# Patient Record
Sex: Female | Born: 1990 | Race: White | Hispanic: No | Marital: Married | Smoking: Former smoker
Health system: Southern US, Community
[De-identification: ages and names within clinical notes are randomized; demographics above are authoritative.]

## PROBLEM LIST (undated history)

## (undated) DIAGNOSIS — Z789 Other specified health status: Secondary | ICD-10-CM

## (undated) DIAGNOSIS — E119 Type 2 diabetes mellitus without complications: Secondary | ICD-10-CM

## (undated) HISTORY — DX: Other specified health status: Z78.9

## (undated) HISTORY — DX: Type 2 diabetes mellitus without complications: E11.9

## (undated) HISTORY — PX: NO PAST SURGERIES: SHX2092

---

## 2013-12-30 ENCOUNTER — Emergency Department (HOSPITAL_COMMUNITY)
Admission: EM | Admit: 2013-12-30 | Discharge: 2013-12-30 | Disposition: A | Discharge status: Home / Self Care | Payer: BC Managed Care – PPO | Attending: Emergency Medicine | Admitting: Emergency Medicine

## 2013-12-30 ENCOUNTER — Encounter (HOSPITAL_COMMUNITY): Payer: Self-pay | Admitting: Emergency Medicine

## 2013-12-30 DIAGNOSIS — Z87891 Personal history of nicotine dependence: Secondary | ICD-10-CM | POA: Insufficient documentation

## 2013-12-30 DIAGNOSIS — Z91040 Latex allergy status: Secondary | ICD-10-CM | POA: Insufficient documentation

## 2013-12-30 DIAGNOSIS — Z3202 Encounter for pregnancy test, result negative: Secondary | ICD-10-CM | POA: Insufficient documentation

## 2013-12-30 DIAGNOSIS — R110 Nausea: Secondary | ICD-10-CM | POA: Insufficient documentation

## 2013-12-30 DIAGNOSIS — R1032 Left lower quadrant pain: Secondary | ICD-10-CM | POA: Insufficient documentation

## 2013-12-30 DIAGNOSIS — R1030 Lower abdominal pain, unspecified: Secondary | ICD-10-CM

## 2013-12-30 LAB — URINALYSIS, ROUTINE W REFLEX MICROSCOPIC
Bilirubin Urine: NEGATIVE
GLUCOSE, UA: NEGATIVE mg/dL
Hgb urine dipstick: NEGATIVE
Ketones, ur: NEGATIVE mg/dL
Leukocytes, UA: NEGATIVE
Nitrite: NEGATIVE
Protein, ur: NEGATIVE mg/dL
Urobilinogen, UA: 0.2 mg/dL (ref 0.0–1.0)
pH: 6 (ref 5.0–8.0)

## 2013-12-30 LAB — WET PREP, GENITAL
Clue Cells Wet Prep HPF POC: NONE SEEN
Trich, Wet Prep: NONE SEEN
WBC, Wet Prep HPF POC: NONE SEEN
Yeast Wet Prep HPF POC: NONE SEEN

## 2013-12-30 LAB — PREGNANCY, URINE: PREG TEST UR: NEGATIVE

## 2013-12-30 MED ORDER — IBUPROFEN 600 MG PO TABS
600.0000 mg | ORAL_TABLET | Freq: Four times a day (QID) | ORAL | Status: DC | PRN
Start: 2013-12-30 — End: 2020-06-19

## 2013-12-30 NOTE — Discharge Instructions (Signed)
Abdominal Pain, Women °Abdominal (stomach, pelvic, or belly) pain can be caused by many things. It is important to tell your doctor: °· The location of the pain. °· Does it come and go or is it present all the time? °· Are there things that start the pain (eating certain foods, exercise)? °· Are there other symptoms associated with the pain (fever, nausea, vomiting, diarrhea)? °All of this is helpful to know when trying to find the cause of the pain. °CAUSES  °· Stomach: virus or bacteria infection, or ulcer. °· Intestine: appendicitis (inflamed appendix), regional ileitis (Crohn's disease), ulcerative colitis (inflamed colon), irritable bowel syndrome, diverticulitis (inflamed diverticulum of the colon), or cancer of the stomach or intestine. °· Gallbladder disease or stones in the gallbladder. °· Kidney disease, kidney stones, or infection. °· Pancreas infection or cancer. °· Fibromyalgia (pain disorder). °· Diseases of the female organs: °· Uterus: fibroid (non-cancerous) tumors or infection. °· Fallopian tubes: infection or tubal pregnancy. °· Ovary: cysts or tumors. °· Pelvic adhesions (scar tissue). °· Endometriosis (uterus lining tissue growing in the pelvis and on the pelvic organs). °· Pelvic congestion syndrome (female organs filling up with blood just before the menstrual period). °· Pain with the menstrual period. °· Pain with ovulation (producing an egg). °· Pain with an IUD (intrauterine device, birth control) in the uterus. °· Cancer of the female organs. °· Functional pain (pain not caused by a disease, may improve without treatment). °· Psychological pain. °· Depression. °DIAGNOSIS  °Your doctor will decide the seriousness of your pain by doing an examination. °· Blood tests. °· X-rays. °· Ultrasound. °· CT scan (computed tomography, special type of X-ray). °· MRI (magnetic resonance imaging). °· Cultures, for infection. °· Barium enema (dye inserted in the large intestine, to better view it with  X-rays). °· Colonoscopy (looking in intestine with a lighted tube). °· Laparoscopy (minor surgery, looking in abdomen with a lighted tube). °· Major abdominal exploratory surgery (looking in abdomen with a large incision). °TREATMENT  °The treatment will depend on the cause of the pain.  °· Many cases can be observed and treated at home. °· Over-the-counter medicines recommended by your caregiver. °· Prescription medicine. °· Antibiotics, for infection. °· Birth control pills, for painful periods or for ovulation pain. °· Hormone treatment, for endometriosis. °· Nerve blocking injections. °· Physical therapy. °· Antidepressants. °· Counseling with a psychologist or psychiatrist. °· Minor or major surgery. °HOME CARE INSTRUCTIONS  °· Do not take laxatives, unless directed by your caregiver. °· Take over-the-counter pain medicine only if ordered by your caregiver. Do not take aspirin because it can cause an upset stomach or bleeding. °· Try a clear liquid diet (broth or water) as ordered by your caregiver. Slowly move to a bland diet, as tolerated, if the pain is related to the stomach or intestine. °· Have a thermometer and take your temperature several times a day, and record it. °· Bed rest and sleep, if it helps the pain. °· Avoid sexual intercourse, if it causes pain. °· Avoid stressful situations. °· Keep your follow-up appointments and tests, as your caregiver orders. °· If the pain does not go away with medicine or surgery, you may try: °· Acupuncture. °· Relaxation exercises (yoga, meditation). °· Group therapy. °· Counseling. °SEEK MEDICAL CARE IF:  °· You notice certain foods cause stomach pain. °· Your home care treatment is not helping your pain. °· You need stronger pain medicine. °· You want your IUD removed. °· You feel faint or   lightheaded. °· You develop nausea and vomiting. °· You develop a rash. °· You are having side effects or an allergy to your medicine. °SEEK IMMEDIATE MEDICAL CARE IF:  °· Your  pain does not go away or gets worse. °· You have a fever. °· Your pain is felt only in portions of the abdomen. The right side could possibly be appendicitis. The left lower portion of the abdomen could be colitis or diverticulitis. °· You are passing blood in your stools (bright red or black tarry stools, with or without vomiting). °· You have blood in your urine. °· You develop chills, with or without a fever. °· You pass out. °MAKE SURE YOU:  °· Understand these instructions. °· Will watch your condition. °· Will get help right away if you are not doing well or get worse. °Document Released: 07/25/2007 Document Revised: 12/20/2011 Document Reviewed: 08/14/2009 °[employer]® Patient Information ©2014 [employer]. ° °

## 2013-12-30 NOTE — ED Notes (Signed)
Pt c/o rlq pain that started after eating breakfast yesterday.  Reports pain went away but came back again after eating breakfast this morning.  Reports nausea, no vomiting.  Denies any vaginal bleeding or discharge.  Denies burning with urination.  LBM was today, LMP was "sometime last month."

## 2013-12-30 NOTE — ED Provider Notes (Signed)
CSN: [id number]     Arrival date & time 12/30/13  1156 History   First MD Initiated Contact with Patient 12/30/13 1214     Chief Complaint  Patient presents with  . Abdominal Pain     (Consider location/radiation/quality/duration/timing/severity/associated sxs/prior Treatment) HPI Pt is a 23yo female c/o RLQ pain that started yesterday after eating breakfast and again today after eating breakfast this morning. Pain is sharp and cramping, 9/10 at worse but currently 5/10.  Pt states pain lasts for 1-4hours. Earlier today standing made pain worse but when pt arrived to ED, walking helped improve pain after sitting for a prolonged period of time. Denies hx of similar pain. LMP only lasted 3 days last month, pt states normally lasts at least 5-6 days.  Pt also reports nausea but no vomiting. Denies fever or diarrhea. LBM was today. No hx of abdominal surgeries.   History reviewed. No pertinent past medical history. History reviewed. No pertinent past surgical history. No family history on file. History  Substance Use Topics  . Smoking status: Former Games developer  . Smokeless tobacco: Not on file  . Alcohol Use: No   OB History   Grav Para Term Preterm Abortions TAB SAB Ect Mult Living                 Review of Systems  Constitutional: Negative for fever and chills.  Respiratory: Negative for cough and shortness of breath.   Cardiovascular: Negative for chest pain.  Gastrointestinal: Positive for nausea and abdominal pain. Negative for vomiting, diarrhea and constipation.  Genitourinary: Positive for pelvic pain. Negative for dysuria, urgency, hematuria, flank pain, vaginal bleeding, vaginal discharge, vaginal pain and menstrual problem.  All other systems reviewed and are negative.      Allergies  Cinnamon; Coconut flavor; and Latex  Home Medications   Current Outpatient Rx  Name  Route  Sig  Dispense  Refill  . ibuprofen (ADVIL,MOTRIN) 600 MG tablet   Oral   Take 1 tablet (600  mg total) by mouth every 6 (six) hours as needed.   30 tablet   0    BP 124/79  Pulse 79  Temp(Src) 98.1 F (36.7 C) (Oral)  Resp 18  Ht 5\' 4"  (1.626 m)  Wt 250 lb (113.399 kg)  BMI 42.89 kg/m2  SpO2 99% Physical Exam  Nursing note and vitals reviewed. Constitutional: She appears well-developed and well-nourished. No distress.  HENT:  Head: Normocephalic and atraumatic.  Eyes: Conjunctivae are normal. No scleral icterus.  Neck: Normal range of motion.  Cardiovascular: Normal rate, regular rhythm and normal heart sounds.   Pulmonary/Chest: Effort normal and breath sounds normal. No respiratory distress. She has no wheezes. She has no rales. She exhibits no tenderness.  Abdominal: Soft. Bowel sounds are normal. She exhibits no distension and no mass. There is tenderness ( LLQ ). There is no rebound and no guarding.  Obese abdomen, soft, tenderness in LLQ. No rebound, guarding or masses. No tenderness in RLQ. No tenderness at [facility]. No Murphy's sign  Genitourinary:  Chaperoned exam. Normal external exam. No vaginal discharge. No cervical discharge, CMT, adnexal tenderness or masses.   Musculoskeletal: Normal range of motion.  Neurological: She is alert.  Skin: Skin is warm and dry. She is not diaphoretic.    ED Course  Procedures (including critical care time) Labs Review Labs Reviewed  URINALYSIS, ROUTINE W REFLEX MICROSCOPIC - Abnormal; Notable for the following:    APPearance HAZY (*)    Specific Gravity,  Urine >1.030 (*)    All other components within normal limits  WET PREP, GENITAL  GC/CHLAMYDIA PROBE AMP  PREGNANCY, URINE   Imaging Review No results found.   EKG Interpretation None      MDM   Final diagnoses:  Lower abdominal pain  Nausea    Pt c/o intermittent lower abdominal pain that started yesterday.  Pt appears well, non-toxic, afebrile. Pt c/o RLQ pain, however, on exam, pt is non-tender in RLQ but tender in LLQ. Abdomen is soft, no  rebound, guarding, or masses. No murphy's sighn.  Not concerned for surgical abdomen.  Pelvic exam: normal, no CMT, adnexal tenderness or masses. Normal wet prep and UA. Urine preg: negative.   Will discharge pt home. No further workup needed at this time, do not believe emergent process is taking place. Will have f/u with PCP. Rx: ibuprofen. Advised to avoid fried food and dairy products until nausea and pain improve.  Return precautions provided. Pt verbalized understanding and agreement with tx plan.     Junius Finnerrin O'Malley, PA-C 12/30/13 [phone]

## 2013-12-30 NOTE — ED Notes (Signed)
Pt alert & oriented x4, stable gait. Patient given discharge instructions, paperwork & prescription(s). Patient  instructed to stop at the registration desk to finish any additional paperwork. Patient verbalized understanding. Pt left department w/ no further questions. 

## 2013-12-31 LAB — GC/CHLAMYDIA PROBE AMP
CT Probe RNA: NEGATIVE
GC Probe RNA: NEGATIVE

## 2014-01-01 NOTE — ED Provider Notes (Signed)
Medical screening examination/treatment/procedure(s) were conducted as a shared visit with non-physician practitioner(s) and myself.  I personally evaluated the patient during the encounter.   EKG Interpretation None     No acute abdomen. Patient is nontender in RLQ  Donnetta HutchingBrian Misheel Gowans, MD 01/01/14 [phone]

## 2018-09-17 ENCOUNTER — Other Ambulatory Visit: Payer: Self-pay

## 2018-09-17 ENCOUNTER — Emergency Department (HOSPITAL_COMMUNITY)
Admission: EM | Admit: 2018-09-17 | Discharge: 2018-09-17 | Disposition: A | Discharge status: Home / Self Care | Payer: Self-pay | Attending: Emergency Medicine | Admitting: Emergency Medicine

## 2018-09-17 ENCOUNTER — Encounter (HOSPITAL_COMMUNITY): Payer: Self-pay | Admitting: Emergency Medicine

## 2018-09-17 DIAGNOSIS — Z87891 Personal history of nicotine dependence: Secondary | ICD-10-CM | POA: Insufficient documentation

## 2018-09-17 DIAGNOSIS — M545 Low back pain, unspecified: Secondary | ICD-10-CM

## 2018-09-17 DIAGNOSIS — Z79899 Other long term (current) drug therapy: Secondary | ICD-10-CM | POA: Insufficient documentation

## 2018-09-17 LAB — URINALYSIS, ROUTINE W REFLEX MICROSCOPIC
Bacteria, UA: NONE SEEN
Bilirubin Urine: NEGATIVE
GLUCOSE, UA: NEGATIVE mg/dL
Ketones, ur: NEGATIVE mg/dL
NITRITE: NEGATIVE
PH: 5 (ref 5.0–8.0)
PROTEIN: 100 mg/dL — AB
RBC / HPF: >50 RBC/hpf — ABNORMAL HIGH (ref 0–5)
Specific Gravity, Urine: 1.028 (ref 1.005–1.030)

## 2018-09-17 MED ORDER — DICLOFENAC SODIUM 50 MG PO TBEC
50.0000 mg | DELAYED_RELEASE_TABLET | Freq: Two times a day (BID) | ORAL | 0 refills | Status: DC
Start: 2018-09-17 — End: 2020-06-19

## 2018-09-17 MED ORDER — METHOCARBAMOL 500 MG PO TABS: 500.0000 mg | ORAL_TABLET | Freq: Two times a day (BID) | ORAL | 0 refills | Status: DC

## 2018-09-17 NOTE — Discharge Instructions (Addendum)
Return if any problems.

## 2018-09-17 NOTE — ED Provider Notes (Signed)
[facility] Provider Note   CSN: [id number] Arrival date & time: 09/17/18  1609     History   Chief Complaint Chief Complaint  Patient presents with  . Back Pain    HPI Rebecca FinnerJennifer Frey is a 27 y.o. female.  The history is provided by the patient. No language interpreter was used.  Back Pain   This is a new problem. The current episode started 6 to 12 hours ago. The problem occurs constantly. The problem has been resolved. The pain is associated with no known injury. She has tried nothing for the symptoms. The treatment provided no relief.   Pt reports she had pain in her low back.  Pt reports taking midol before coming in.  Pt reports pain has resolved No past medical history on file.  There are no active problems to display for this patient.   No past surgical history on file.   OB History   None      Home Medications    Prior to Admission medications   Medication Sig Start Date End Date Taking? Authorizing Provider  diclofenac (VOLTAREN) 50 MG EC tablet Take 1 tablet (50 mg total) by mouth 2 (two) times daily. 09/17/18   Elson AreasSofia, Huston Stonehocker K, PA-C  ibuprofen (ADVIL,MOTRIN) 600 MG tablet Take 1 tablet (600 mg total) by mouth every 6 (six) hours as needed. 12/30/13   Lurene ShadowPhelps, Erin O, PA-C  methocarbamol (ROBAXIN) 500 MG tablet Take 1 tablet (500 mg total) by mouth 2 (two) times daily. 09/17/18   Elson AreasSofia, Jayjay Littles K, PA-C    Family History No family history on file.  Social History Social History   Tobacco Use  . Smoking status: Former Smoker    Types: Cigarettes  . Smokeless tobacco: Never Used  Substance Use Topics  . Alcohol use: No  . Drug use: No     Allergies   Cinnamon; Coconut flavor; and Latex   Review of Systems Review of Systems  Musculoskeletal: Positive for back pain.  All other systems reviewed and are negative.    Physical Exam Updated Vital Signs BP (!) 161/94 (BP Location: Right Arm)   Pulse 85   Temp 98.3 F (36.8  C) (Oral)   Resp 14   Ht 5\' 4"  (1.626 m)   Wt 127.5 kg   LMP 09/17/2018   SpO2 100%   BMI 48.23 kg/m   Physical Exam  Constitutional: She is oriented to person, place, and time. She appears well-developed and well-nourished.  HENT:  Head: Normocephalic.  Eyes: EOM are normal.  Neck: Normal range of motion.  Cardiovascular: Normal rate.  Pulmonary/Chest: Effort normal.  Abdominal: She exhibits no distension.  Musculoskeletal: Normal range of motion.  Neurological: She is alert and oriented to person, place, and time.  Skin: Skin is warm.  Psychiatric: She has a normal mood and affect.  Nursing note and vitals reviewed.    ED Treatments / Results  Labs (all labs ordered are listed, but only abnormal results are displayed) Labs Reviewed  URINALYSIS, ROUTINE W REFLEX MICROSCOPIC - Abnormal; Notable for the following components:      Result Value   APPearance HAZY (*)    Hgb urine dipstick LARGE (*)    Protein, ur 100 (*)    Leukocytes, UA TRACE (*)    RBC / HPF >50 (*)    All other components within normal limits    EKG None  Radiology No results found.  Procedures Procedures (including critical care time)  Medications Ordered in ED Medications - No data to display   Initial Impression / Assessment and Plan / ED Course  I have reviewed the triage vital signs and the nursing notes.  Pertinent labs & imaging results that were available during my care of the patient were reviewed by me and considered in my medical decision making (see chart for details).     MDM ua shows blood,  Pt is on menses.  Pain sounds as if it was muscular.  I will give rx and pt can fill if pain returns   Final Clinical Impressions(s) / ED Diagnoses   Final diagnoses:  Acute low back pain, unspecified back pain laterality, unspecified whether sciatica present    ED Discharge Orders         Ordered    diclofenac (VOLTAREN) 50 MG EC tablet  2 times daily     09/17/18 1741     methocarbamol (ROBAXIN) 500 MG tablet  2 times daily     09/17/18 1741        An After Visit Summary was printed and given to the patient.    Elson Areas, Cordelia Poche 09/17/18 2052    Vanetta Mulders, MD 09/18/18 1530

## 2018-09-17 NOTE — ED Triage Notes (Addendum)
Patient c/o lower back pain that started this morning after waking up this morning. Patient denies any injury, radiation, or urinary symptoms. Per patient pain worse with movement. Denies any c/o in urination or BMs. CNS intact. Took Aleve with brief relief and midol 40 minutes prior to arriving to ED with no relief.

## 2018-09-17 NOTE — ED Notes (Signed)
Pt is on menstrual cycle.  Urine taken to lab.

## 2019-04-29 ENCOUNTER — Emergency Department (HOSPITAL_COMMUNITY)
Admission: EM | Admit: 2019-04-29 | Discharge: 2019-04-29 | Disposition: A | Discharge status: Home / Self Care | Payer: Self-pay | Attending: Emergency Medicine | Admitting: Emergency Medicine

## 2019-04-29 ENCOUNTER — Other Ambulatory Visit: Payer: Self-pay

## 2019-04-29 ENCOUNTER — Encounter (HOSPITAL_COMMUNITY): Payer: Self-pay | Admitting: Emergency Medicine

## 2019-04-29 DIAGNOSIS — Z91040 Latex allergy status: Secondary | ICD-10-CM | POA: Insufficient documentation

## 2019-04-29 DIAGNOSIS — Z87891 Personal history of nicotine dependence: Secondary | ICD-10-CM | POA: Insufficient documentation

## 2019-04-29 DIAGNOSIS — H9201 Otalgia, right ear: Secondary | ICD-10-CM

## 2019-04-29 DIAGNOSIS — H6091 Unspecified otitis externa, right ear: Secondary | ICD-10-CM

## 2019-04-29 DIAGNOSIS — H608X1 Other otitis externa, right ear: Secondary | ICD-10-CM | POA: Insufficient documentation

## 2019-04-29 MED ORDER — NEOMYCIN-POLYMYXIN-HC 1 % OT SOLN
4.0000 [drp] | Freq: Once | OTIC | Status: AC
  Administered 2019-04-29: 4 [drp] via OTIC
  Filled 2019-04-29: qty 10

## 2019-04-29 MED ORDER — TRAMADOL HCL 50 MG PO TABS
100.0000 mg | ORAL_TABLET | Freq: Once | ORAL | Status: AC
  Administered 2019-04-29: 100 mg via ORAL
  Filled 2019-04-29: qty 2

## 2019-04-29 MED ORDER — ONDANSETRON HCL 4 MG PO TABS
4.0000 mg | ORAL_TABLET | Freq: Once | ORAL | Status: AC
  Administered 2019-04-29: 4 mg via ORAL
  Filled 2019-04-29: qty 1

## 2019-04-29 MED ORDER — IBUPROFEN 800 MG PO TABS
800.0000 mg | ORAL_TABLET | Freq: Once | ORAL | Status: AC
  Administered 2019-04-29: 800 mg via ORAL
  Filled 2019-04-29: qty 1

## 2019-04-29 MED ORDER — CEPHALEXIN 500 MG PO CAPS
500.0000 mg | ORAL_CAPSULE | Freq: Once | ORAL | Status: AC
  Administered 2019-04-29: 500 mg via ORAL
  Filled 2019-04-29: qty 1

## 2019-04-29 MED ORDER — CEPHALEXIN 500 MG PO CAPS: 500.0000 mg | ORAL_CAPSULE | Freq: Four times a day (QID) | ORAL | 0 refills | Status: DC

## 2019-04-29 MED ORDER — TRAMADOL HCL 50 MG PO TABS: 50.0000 mg | ORAL_TABLET | Freq: Four times a day (QID) | ORAL | 0 refills | Status: DC | PRN

## 2019-04-29 NOTE — ED Provider Notes (Signed)
[facility] Provider Note   CSN: [id number] Arrival date & time: 04/29/19  1226     History   Chief Complaint Chief Complaint  Patient presents with  . Otalgia    right    HPI Rebecca FinnerJennifer Frey is a 28 y.o. female.     The history is provided by the patient.  Otalgia Location:  Right Behind ear:  No abnormality Quality:  Throbbing Severity:  Moderate Onset quality:  Gradual Duration:  4 days Timing:  Intermittent Progression:  Worsening Chronicity:  New Context comment:  Use ear buds alot. Uses Q-tips Relieved by:  Nothing Worsened by:  Palpation (movement) Ineffective treatments:  OTC medications Associated symptoms: ear discharge and headaches   Associated symptoms: no abdominal pain, no cough, no fever, no neck pain, no sore throat and no vomiting   Risk factors: no recent travel, no chronic ear infection and no prior ear surgery     History reviewed. No pertinent past medical history.  There are no active problems to display for this patient.   History reviewed. No pertinent surgical history.   OB History   No obstetric history on file.      Home Medications    Prior to Admission medications   Medication Sig Start Date End Date Taking? Authorizing Provider  diclofenac (VOLTAREN) 50 MG EC tablet Take 1 tablet (50 mg total) by mouth 2 (two) times daily. 09/17/18   Elson AreasSofia, Leslie K, PA-C  ibuprofen (ADVIL,MOTRIN) 600 MG tablet Take 1 tablet (600 mg total) by mouth every 6 (six) hours as needed. 12/30/13   Lurene ShadowPhelps, Erin O, PA-C  methocarbamol (ROBAXIN) 500 MG tablet Take 1 tablet (500 mg total) by mouth 2 (two) times daily. 09/17/18   Elson AreasSofia, Leslie K, PA-C    Family History History reviewed. No pertinent family history.  Social History Social History   Tobacco Use  . Smoking status: Former Smoker    Types: Cigarettes  . Smokeless tobacco: Never Used  Substance Use Topics  . Alcohol use: No  . Drug use: No     Allergies    Cinnamon, Coconut flavor, and Latex   Review of Systems Review of Systems  Constitutional: Negative for activity change and fever.       All ROS Neg except as noted in HPI  HENT: Positive for ear discharge and ear pain. Negative for nosebleeds and sore throat.   Eyes: Negative for photophobia and discharge.  Respiratory: Negative for cough, shortness of breath and wheezing.   Cardiovascular: Negative for chest pain and palpitations.  Gastrointestinal: Negative for abdominal pain, blood in stool and vomiting.  Genitourinary: Negative for dysuria, frequency and hematuria.  Musculoskeletal: Negative for arthralgias, back pain and neck pain.  Skin: Negative.   Neurological: Positive for headaches. Negative for dizziness, seizures and speech difficulty.  Psychiatric/Behavioral: Negative for confusion and hallucinations.     Physical Exam Updated Vital Signs BP 140/89 (BP Location: Left Arm)   Pulse 94   Temp 98.7 F (37.1 C) (Oral)   Resp 16   Ht 5\' 4"  (1.626 m)   Wt 122.5 kg   LMP 04/11/2019 (Exact Date)   SpO2 96%   BMI 46.35 kg/m   Physical Exam Vitals signs and nursing note reviewed.  Constitutional:      Appearance: She is well-developed. She is not toxic-appearing.  HENT:     Head: Normocephalic.     Right Ear: Tympanic membrane and external ear normal.     Left Ear:  Tympanic membrane and external ear normal.     Ears:     Comments: There is mild to moderate swelling of the preauricular nodes.  There is swelling of the external auditory canal on the right.  There is mild drainage from the ear.  The right tympanic membrane cannot be visualized.  There is no pain or swelling behind the ear.  There is no pain or swelling of the left ear.  The tympanic membrane is without bulging, redness, or problem. Eyes:     General: Lids are normal.     Pupils: Pupils are equal, round, and reactive to light.  Neck:     Musculoskeletal: Normal range of motion and neck supple.      Vascular: No carotid bruit.  Cardiovascular:     Rate and Rhythm: Normal rate and regular rhythm.     Pulses: Normal pulses.     Heart sounds: Normal heart sounds.  Pulmonary:     Effort: No respiratory distress.     Breath sounds: Normal breath sounds.  Abdominal:     General: Bowel sounds are normal.     Palpations: Abdomen is soft.     Tenderness: There is no abdominal tenderness. There is no guarding.  Musculoskeletal: Normal range of motion.  Lymphadenopathy:     Head:     Right side of head: No submandibular adenopathy.     Left side of head: No submandibular adenopathy.     Cervical: No cervical adenopathy.  Skin:    General: Skin is warm and dry.  Neurological:     Mental Status: She is alert and oriented to person, place, and time.     Cranial Nerves: No cranial nerve deficit.     Sensory: No sensory deficit.  Psychiatric:        Speech: Speech normal.      ED Treatments / Results  Labs (all labs ordered are listed, but only abnormal results are displayed) Labs Reviewed - No data to display  EKG None  Radiology No results found.  Procedures Procedures (including critical care time)  Medications Ordered in ED Medications  NEOMYCIN-POLYMYXIN-HYDROCORTISONE (CORTISPORIN) OTIC (EAR) solution 4 drop (has no administration in time range)  traMADol (ULTRAM) tablet 100 mg (has no administration in time range)  cephALEXin (KEFLEX) capsule 500 mg (has no administration in time range)  ondansetron (ZOFRAN) tablet 4 mg (has no administration in time range)  ibuprofen (ADVIL) tablet 800 mg (has no administration in time range)     Initial Impression / Assessment and Plan / ED Course  I have reviewed the triage vital signs and the nursing notes.  Pertinent labs & imaging results that were available during my care of the patient were reviewed by me and considered in my medical decision making (see chart for details).          Final Clinical Impressions(s) /  ED Diagnoses MdM  Vital signs within normal limits.  Pulse oximetry is 96% on room air.  Within normal limits by my interpretation.  The patient uses ear buds frequently and she also uses Q-tips.  The patient has a right external otitis.  The patient has some mild drainage present.  The patient will be treated with Cortisporin otic 4 drops 3 times a day, and Keflex.  Patient states that she at times has problems with nausea when taking antibiotics, will use Zofran for the nausea.  Patient is to follow-up with her primary physician or return to the emergency department if  any changes in condition, problems, or concerns.   Final diagnoses:  Otitis externa of right ear, unspecified chronicity, unspecified type  Otalgia of right ear    ED Discharge Orders         Ordered    cephALEXin (KEFLEX) 500 MG capsule  4 times daily     04/29/19 1325    traMADol (ULTRAM) 50 MG tablet  Every 6 hours PRN     04/29/19 1325           Ivery QualeBryant, Desi Rowe, PA-C 04/29/19 1328    Eber HongMiller, Brian, MD 04/29/19 [phone]

## 2019-04-29 NOTE — ED Triage Notes (Signed)
C/o right ear pain.  Pt used Q-tip about four days ago, report pain to ear prior.  Reports drainage from right eat.  Rates pain 6/10.

## 2019-04-29 NOTE — Discharge Instructions (Addendum)
You have an infection of the external ear.  Please refrain from using ear buds and or Q-tips until this has completely resolved.  Please wash hands frequently.  Use 4 drops of the Cortisporin to the right ear 3 times a day use Keflex with each meal and at bedtime until all taken.  Use 600 mg of ibuprofen with breakfast, lunch, dinner, and at bedtime.  May use Ultram for more severe pain. This medication may cause drowsiness. Please do not drink, drive, or participate in activity that requires concentration while taking this medication.

## 2020-06-19 ENCOUNTER — Other Ambulatory Visit: Payer: Self-pay

## 2020-06-19 ENCOUNTER — Encounter: Payer: Self-pay | Admitting: Adult Health

## 2020-06-19 ENCOUNTER — Ambulatory Visit (INDEPENDENT_AMBULATORY_CARE_PROVIDER_SITE_OTHER): Payer: Self-pay | Admitting: Adult Health

## 2020-06-19 ENCOUNTER — Ambulatory Visit (INDEPENDENT_AMBULATORY_CARE_PROVIDER_SITE_OTHER): Payer: Self-pay

## 2020-06-19 VITALS — BP 143/84 | HR 76 | Ht 64.0 in | Wt 272.0 lb

## 2020-06-19 DIAGNOSIS — O3680X0 Pregnancy with inconclusive fetal viability, not applicable or unspecified: Secondary | ICD-10-CM | POA: Insufficient documentation

## 2020-06-19 DIAGNOSIS — Z3A13 13 weeks gestation of pregnancy: Secondary | ICD-10-CM | POA: Insufficient documentation

## 2020-06-19 DIAGNOSIS — Z3201 Encounter for pregnancy test, result positive: Secondary | ICD-10-CM | POA: Insufficient documentation

## 2020-06-19 LAB — POCT URINE PREGNANCY: Preg Test, Ur: POSITIVE — AB

## 2020-06-19 MED ORDER — FLINTSTONES COMPLETE 18 MG PO CHEW: CHEWABLE_TABLET | ORAL | Status: AC

## 2020-06-19 NOTE — Progress Notes (Signed)
[place] 8+6 wks,single IUP with YS,positive fht 174 bpm,crl 22.2 mm,normal ovaries

## 2020-06-19 NOTE — Progress Notes (Signed)
  Subjective:     Patient ID: Rebecca Frey, female   DOB: [birth year], 29 y.o.   MRN: [id number]  HPI Rebecca Frey is a 29 year old white female, married, in for UPT, has missed periods and had 3+HPTs. Last period was light.   Review of Systems +missed periods 3+HPTs +nausea Reviewed past medical,surgical, social and family history. Reviewed medications and allergies.     Objective:   Physical Exam BP (!) 143/84 (BP Location: Left Arm, Patient Position: Sitting, Cuff Size: Large)   Pulse 76   Ht 5\' 4"  (1.626 m)   Wt 272 lb (123.4 kg)   LMP 03/19/2020   BMI 46.69 kg/m UPT +, about 13+1 week by LMP with EDD 12/25/19. Skin warm and dry. Neck: mid line trachea, normal thyroid, good ROM, no lymphadenopathy noted. Lungs: clear to ausculation bilaterally. Cardiovascular: regular rate and rhythm.Abdomen is soft and non tender AA is 2 Fall risk is low PHQ 9 score is 12, no SI declines meds    Upstream - 06/19/20 1456      Pregnancy Intention Screening   Does the patient want to become pregnant in the next year? Yes    Does the patient's partner want to become pregnant in the next year? Yes    Would the patient like to discuss contraceptive options today? N/A      Contraception Wrap Up   Current Method Pregnant/Seeking Pregnancy    End Method Pregnant/Seeking Pregnancy    Contraception Counseling Provided No             Assessment:     1. Positive pregnancy test Take 2 [place] daily  2. [redacted] weeks gestation of pregnancy Eat often   3. Encounter to determine fetal viability of pregnancy, single or unspecified fetus Get dating 08/19/20 today     Plan:     Review handout by [facility]

## 2020-07-15 ENCOUNTER — Encounter: Payer: Self-pay | Admitting: Advanced Practice Midwife

## 2020-07-15 DIAGNOSIS — O0990 Supervision of high risk pregnancy, unspecified, unspecified trimester: Secondary | ICD-10-CM | POA: Insufficient documentation

## 2020-07-16 ENCOUNTER — Other Ambulatory Visit: Payer: Self-pay | Admitting: Obstetrics & Gynecology

## 2020-07-16 DIAGNOSIS — Z3682 Encounter for antenatal screening for nuchal translucency: Secondary | ICD-10-CM

## 2020-07-17 ENCOUNTER — Encounter: Payer: Self-pay | Admitting: Advanced Practice Midwife

## 2020-07-17 ENCOUNTER — Ambulatory Visit (INDEPENDENT_AMBULATORY_CARE_PROVIDER_SITE_OTHER): Payer: Self-pay | Admitting: Advanced Practice Midwife

## 2020-07-17 ENCOUNTER — Ambulatory Visit (INDEPENDENT_AMBULATORY_CARE_PROVIDER_SITE_OTHER): Payer: Medicaid Other

## 2020-07-17 ENCOUNTER — Other Ambulatory Visit: Payer: Self-pay

## 2020-07-17 ENCOUNTER — Ambulatory Visit: Payer: Self-pay | Admitting: *Deleted

## 2020-07-17 VITALS — BP 131/86 | HR 70 | Wt 268.0 lb

## 2020-07-17 DIAGNOSIS — Z3682 Encounter for antenatal screening for nuchal translucency: Secondary | ICD-10-CM

## 2020-07-17 DIAGNOSIS — Z3401 Encounter for supervision of normal first pregnancy, first trimester: Secondary | ICD-10-CM | POA: Diagnosis not present

## 2020-07-17 DIAGNOSIS — Z3A12 12 weeks gestation of pregnancy: Secondary | ICD-10-CM | POA: Diagnosis not present

## 2020-07-17 DIAGNOSIS — Z1389 Encounter for screening for other disorder: Secondary | ICD-10-CM

## 2020-07-17 DIAGNOSIS — Z331 Pregnant state, incidental: Secondary | ICD-10-CM

## 2020-07-17 LAB — POCT URINALYSIS DIPSTICK OB
Blood, UA: NEGATIVE
Glucose, UA: NEGATIVE
Ketones, UA: NEGATIVE
Leukocytes, UA: NEGATIVE
Nitrite, UA: NEGATIVE
POC,PROTEIN,UA: NEGATIVE

## 2020-07-17 MED ORDER — BLOOD PRESSURE MONITOR MISC: 0 refills | Status: DC

## 2020-07-17 NOTE — Progress Notes (Signed)
[place] 12+6 wks,measurements c/w dates,crl 64.84 mm,NB present,FHR 148 bpm,normal ovaries,unable to obtain NT because of fetal position and pt body habitus

## 2020-07-17 NOTE — Progress Notes (Signed)
INITIAL OBSTETRICAL VISIT Patient name: Tenessa Marsee MRN [id number]  Date of birth: Jan 18, 1991 Chief Complaint:   Initial Prenatal Visit (nt/it)  History of Present Illness:   Madie Cahn is a 29 y.o. G1P0 Caucasian female at [redacted]w[redacted]d by [place] at 8 weeks with an Estimated Date of Delivery: 01/23/21 being seen today for her initial obstetrical visit.   Her obstetrical history is significant for first pregnancy.   Today she reports some burning pain in right hip/thigh.  Can't remember if she has ever had a pap, but has had pelvic exams before  Depression screen [facility] 2/9 07/17/2020 06/19/2020  Decreased Interest 2 1  Down, Depressed, Hopeless 1 0  PHQ - 2 Score 3 1  Altered sleeping 2 3  Tired, decreased energy 2 3  Change in appetite 3 2  Feeling bad or failure about yourself  0 0  Trouble concentrating 3 3  Moving slowly or fidgety/restless 0 0  Suicidal thoughts 0 0  PHQ-9 Score 13 12  Difficult doing work/chores - Not difficult at all   Sx are c/w 1st trimester pregnancy  Patient's last menstrual period was 03/19/2020. Last pap unsure Review of Systems:   Pertinent items are noted in HPI Denies cramping/contractions, leakage of fluid, vaginal bleeding, abnormal vaginal discharge w/ itching/odor/irritation, headaches, visual changes, shortness of breath, chest pain, abdominal pain, severe nausea/vomiting, or problems with urination or bowel movements unless otherwise stated above.  Pertinent History Reviewed:  Reviewed past medical,surgical, social, obstetrical and family history.  Reviewed problem list, medications and allergies. OB History  Gravida Para Term Preterm AB Living  1            SAB TAB Ectopic Multiple Live Births               # Outcome Date GA Lbr Len/2nd Weight Sex Delivery Anes PTL Lv  1 Current            Physical Assessment:   Vitals:   07/17/20 1608  BP: 131/86  Pulse: 70  Weight: 268 lb (121.6 kg)  Body mass index is 46 kg/m.       Physical  Examination:  General appearance - well appearing, and in no distress  Mental status - alert, oriented to person, place, and time  Psych:  She has a normal mood and affect  Skin - warm and dry, normal color, no suspicious lesions noted  Chest - effort normal  Heart - normal rate and regular rhythm  Abdomen - soft, nontender  Extremities:  No swelling or varicosities noted   TODAY'S NT [place] 12+6 wks,measurements c/w dates,crl 64.84 mm,NB present,FHR 148 bpm,normal ovaries,unable to obtain NT because of fetal position and pt body habitus   Results for orders placed or performed in visit on 07/17/20 (from the past 24 hour(s))  POC Urinalysis Dipstick OB   Collection Time: 07/17/20  4:31 PM  Result Value Ref Range   Color, UA     Clarity, UA     Glucose, UA Negative Negative   Bilirubin, UA     Ketones, UA neg    Spec Grav, UA     Blood, UA neg    pH, UA     POC,PROTEIN,UA Negative Negative, Trace, Small (1+), Moderate (2+), Large (3+), 4+   Urobilinogen, UA     Nitrite, UA neg    Leukocytes, UA Negative Negative   Appearance     Odor      Assessment & Plan:  1) Low-Risk Pregnancy  G1P0 at [redacted]w[redacted]d with an Estimated Date of Delivery: 01/23/21   2) Initial OB visit   Meds:  Meds ordered this encounter  Medications  . Blood Pressure Monitor MISC    Sig: For regular home bp monitoring during pregnancy    Dispense:  1 each    Refill:  0    Z34.81  Needs large cuff    OB appt today started at 1600, so no time for pap today.  Initial labs obtained Continue prenatal vitamins Reviewed n/v relief measures and warning s/s to report Reviewed recommended weight gain based on pre-gravid BMI Encouraged well-balanced diet Genetic & carrier screening discussed: requests Panorama, AFP and Horizon 14 , declines NT/IT Ultrasound discussed; fetal survey: requested CCNC completed> form faxed if has or is planning to apply for medicaid The nature of [facility] [employer]  with multiple MDs and other Advanced Practice Providers was explained to patient; also emphasized that fellows, residents, and students are part of our team.  Rx faxed. Check bp weekly, let us know if >140/90.        Jacklyn Shell 4:38 PM

## 2020-07-17 NOTE — Patient Instructions (Signed)
Rebecca Frey, I greatly value your feedback.  If you receive a survey following your visit with [place] today, we appreciate you taking the time to fill it out.  Thanks, Cathie Beams, DNP, CNM  [facility] HAS MOVED!!! It is now [facility] at [facility] ([address] [place], [place] [address]) Entrance located off of [address] Free 24/7 valet parking   Nausea & Vomiting  Have saltine crackers or pretzels by your bed and eat a few bites before you raise your head out of bed in the morning  Eat small frequent meals throughout the day instead of large meals  Drink plenty of fluids throughout the day to stay hydrated, just don't drink a lot of fluids with your meals.  This can make your stomach fill up faster making you feel sick  Do not brush your teeth right after you eat  Products with real ginger are good for nausea, like ginger ale and ginger hard candy Make sure it says made with real ginger!  Sucking on sour candy like lemon heads is also good for nausea  If your prenatal vitamins make you nauseated, take them at night so you will sleep through the nausea  Sea Bands  If you feel like you need medicine for the nausea & vomiting please let us know  If you are unable to keep any fluids or food down please let us know   Constipation  Drink plenty of fluid, preferably water, throughout the day  Eat foods high in fiber such as fruits, vegetables, and grains  Exercise, such as walking, is a good way to keep your bowels regular  Drink warm fluids, especially warm prune juice, or decaf coffee  Eat a 1/2 cup of real oatmeal (not instant), 1/2 cup applesauce, and 1/2-1 cup warm prune juice every day  If needed, you may take Colace (docusate sodium) stool softener once or twice a day to help keep the stool soft.   If you still are having problems with constipation, you may take Miralax once daily as needed to help keep your bowels regular.   Home  Blood Pressure Monitoring for Patients   Your provider has recommended that you check your blood pressure (BP) at least once a week at home. If you do not have a blood pressure cuff at home, one will be provided for you. Contact your provider if you have not received your monitor within 1 week.   Helpful Tips for Accurate Home Blood Pressure Checks   Don't smoke, exercise, or drink caffeine 30 minutes before checking your BP  Use the restroom before checking your BP (a full bladder can raise your pressure)  Relax in a comfortable upright chair  Feet on the ground  Left arm resting comfortably on a flat surface at the level of your heart  Legs uncrossed  Back supported  Sit quietly and don't talk  Place the cuff on your bare arm  Adjust snuggly, so that only two fingertips can fit between your skin and the top of the cuff  Check 2 readings separated by at least one minute  Keep a log of your BP readings  For a visual, please reference this diagram: http://ccnc.care/bpdiagram  Provider Name: [facility]     Phone: [phone]  Zone 1: ALL CLEAR  Continue to monitor your symptoms:   BP reading is less than 140 (top number) or less than 90 (bottom number)   No right upper stomach pain  No headaches or seeing spots  No feeling nauseated or throwing up  No swelling in face and hands  Zone 2: CAUTION Call your doctor's office for any of the following:   BP reading is greater than 140 (top number) or greater than 90 (bottom number)   Stomach pain under your ribs in the middle or right side  Headaches or seeing spots  Feeling nauseated or throwing up  Swelling in face and hands  Zone 3: EMERGENCY  Seek immediate medical care if you have any of the following:   BP reading is greater than160 (top number) or greater than 110 (bottom number)  Severe headaches not improving with Tylenol  Serious difficulty catching your breath  Any worsening symptoms  from Zone 2    First Trimester of Pregnancy The first trimester of pregnancy is from week 1 until the end of week 12 (months 1 through 3). A week after a sperm fertilizes an egg, the egg will implant on the wall of the uterus. This embryo will begin to develop into a baby. Genes from you and your partner are forming the baby. The female genes determine whether the baby is a boy or a girl. At 6-8 weeks, the eyes and face are formed, and the heartbeat can be seen on ultrasound. At the end of 12 weeks, all the baby's organs are formed.  Now that you are pregnant, you will want to do everything you can to have a healthy baby. Two of the most important things are to get good prenatal care and to follow your health care provider's instructions. Prenatal care is all the medical care you receive before the baby's birth. This care will help prevent, find, and treat any problems during the pregnancy and childbirth. BODY CHANGES Your body goes through many changes during pregnancy. The changes vary from woman to woman.   You may gain or lose a couple of pounds at first.  You may feel sick to your stomach (nauseous) and throw up (vomit). If the vomiting is uncontrollable, call your health care provider.  You may tire easily.  You may develop headaches that can be relieved by medicines approved by your health care provider.  You may urinate more often. Painful urination may mean you have a bladder infection.  You may develop heartburn as a result of your pregnancy.  You may develop constipation because certain hormones are causing the muscles that push waste through your intestines to slow down.  You may develop hemorrhoids or swollen, bulging veins (varicose veins).  Your breasts may begin to grow larger and become tender. Your nipples may stick out more, and the tissue that surrounds them (areola) may become darker.  Your gums may bleed and may be sensitive to brushing and flossing.  Dark spots or  blotches (chloasma, mask of pregnancy) may develop on your face. This will likely fade after the baby is born.  Your menstrual periods will stop.  You may have a loss of appetite.  You may develop cravings for certain kinds of food.  You may have changes in your emotions from day to day, such as being excited to be pregnant or being concerned that something may go wrong with the pregnancy and baby.  You may have more vivid and strange dreams.  You may have changes in your hair. These can include thickening of your hair, rapid growth, and changes in texture. Some women also have hair loss during or after pregnancy, or hair that feels dry  or thin. Your hair will most likely return to normal after your baby is born. WHAT TO EXPECT AT YOUR PRENATAL VISITS During a routine prenatal visit:  You will be weighed to make sure you and the baby are growing normally.  Your blood pressure will be taken.  Your abdomen will be measured to track your baby's growth.  The fetal heartbeat will be listened to starting around week 10 or 12 of your pregnancy.  Test results from any previous visits will be discussed. Your health care provider may ask you:  How you are feeling.  If you are feeling the baby move.  If you have had any abnormal symptoms, such as leaking fluid, bleeding, severe headaches, or abdominal cramping.  If you have any questions. Other tests that may be performed during your first trimester include:  Blood tests to find your blood type and to check for the presence of any previous infections. They will also be used to check for low iron levels (anemia) and Rh antibodies. Later in the pregnancy, blood tests for diabetes will be done along with other tests if problems develop.  Urine tests to check for infections, diabetes, or protein in the urine.  An ultrasound to confirm the proper growth and development of the baby.  An amniocentesis to check for possible genetic  problems.  Fetal screens for spina bifida and Down syndrome.  You may need other tests to make sure you and the baby are doing well. HOME CARE INSTRUCTIONS  Medicines  Follow your health care provider's instructions regarding medicine use. Specific medicines may be either safe or unsafe to take during pregnancy.  Take your prenatal vitamins as directed.  If you develop constipation, try taking a stool softener if your health care provider approves. Diet  Eat regular, well-balanced meals. Choose a variety of foods, such as meat or vegetable-based protein, fish, milk and low-fat dairy products, vegetables, fruits, and whole grain breads and cereals. Your health care provider will help you determine the amount of weight gain that is right for you.  Avoid raw meat and uncooked cheese. These carry germs that can cause birth defects in the baby.  Eating four or five small meals rather than three large meals a day may help relieve nausea and vomiting. If you start to feel nauseous, eating a few soda crackers can be helpful. Drinking liquids between meals instead of during meals also seems to help nausea and vomiting.  If you develop constipation, eat more high-fiber foods, such as fresh vegetables or fruit and whole grains. Drink enough fluids to keep your urine clear or pale yellow. Activity and Exercise  Exercise only as directed by your health care provider. Exercising will help you:  Control your weight.  Stay in shape.  Be prepared for labor and delivery.  Experiencing pain or cramping in the lower abdomen or low back is a good sign that you should stop exercising. Check with your health care provider before continuing normal exercises.  Try to avoid standing for long periods of time. Move your legs often if you must stand in one place for a long time.  Avoid heavy lifting.  Wear low-heeled shoes, and practice good posture.  You may continue to have sex unless your health care  provider directs you otherwise. Relief of Pain or Discomfort  Wear a good support bra for breast tenderness.    Take warm sitz baths to soothe any pain or discomfort caused by hemorrhoids. Use hemorrhoid cream if your  health care provider approves.    Rest with your legs elevated if you have leg cramps or low back pain.  If you develop varicose veins in your legs, wear support hose. Elevate your feet for 15 minutes, 3-4 times a day. Limit salt in your diet. Prenatal Care  Schedule your prenatal visits by the twelfth week of pregnancy. They are usually scheduled monthly at first, then more often in the last 2 months before delivery.  Write down your questions. Take them to your prenatal visits.  Keep all your prenatal visits as directed by your health care provider. Safety  Wear your seat belt at all times when driving.  Make a list of emergency phone numbers, including numbers for family, friends, the hospital, and police and fire departments. General Tips  Ask your health care provider for a referral to a local prenatal education class. Begin classes no later than at the beginning of month 6 of your pregnancy.  Ask for help if you have counseling or nutritional needs during pregnancy. Your health care provider can offer advice or refer you to specialists for help with various needs.  Do not use hot tubs, steam rooms, or saunas.  Do not douche or use tampons or scented sanitary pads.  Do not cross your legs for long periods of time.  Avoid cat litter boxes and soil used by cats. These carry germs that can cause birth defects in the baby and possibly loss of the fetus by miscarriage or stillbirth.  Avoid all smoking, herbs, alcohol, and medicines not prescribed by your health care provider. Chemicals in these affect the formation and growth of the baby.  Schedule a dentist appointment. At home, brush your teeth with a soft toothbrush and be gentle when you floss. SEEK MEDICAL  CARE IF:   You have dizziness.  You have mild pelvic cramps, pelvic pressure, or nagging pain in the abdominal area.  You have persistent nausea, vomiting, or diarrhea.  You have a bad smelling vaginal discharge.  You have pain with urination.  You notice increased swelling in your face, hands, legs, or ankles. SEEK IMMEDIATE MEDICAL CARE IF:   You have a fever.  You are leaking fluid from your vagina.  You have spotting or bleeding from your vagina.  You have severe abdominal cramping or pain.  You have rapid weight gain or loss.  You vomit blood or material that looks like coffee grounds.  You are exposed to [place] measles and have never had them.  You are exposed to fifth disease or chickenpox.  You develop a severe headache.  You have shortness of breath.  You have any kind of trauma, such as from a fall or a car accident. Document Released: 09/21/2001 Document Revised: 02/11/2014 Document Reviewed: 08/07/2013 [facility] Patient Information 2015 [place], [place]. This information is not intended to replace advice given to you by your health care provider. Make sure you discuss any questions you have with your health care provider.  Coronavirus (COVID-19) Are you at risk?  Are you at risk for the Coronavirus (COVID-19)?  To be considered HIGH RISK for Coronavirus (COVID-19), you have to meet the following criteria:   Traveled to [place], [place], [place], [place] or [place]; or in the [place] to [place], [place], [place], or [place]; and have fever, cough, and shortness of breath within the last 2 weeks of travel OR  Been in close contact with a person diagnosed with COVID-19 within the last 2 weeks and  have fever, cough, and shortness of breath  IF YOU DO NOT MEET THESE CRITERIA, YOU ARE CONSIDERED LOW RISK FOR COVID-19.  What to do if you are HIGH RISK for COVID-19?   If you are having a medical emergency, call 911.  Seek medical care right away.  Before you go to a doctors office, urgent care or emergency department, call ahead and tell them about your recent travel, contact with someone diagnosed with COVID-19, and your symptoms. You should receive instructions from your physicians office regarding next steps of care.   When you arrive at healthcare provider, tell the healthcare staff immediately you have returned from visiting [place], [place], [place], [place] or [place]; or traveled in the [place] to [place], [place], [place], or [place]; in the last two weeks or you have been in close contact with a person diagnosed with COVID-19 in the last 2 weeks.    Tell the health care staff about your symptoms: fever, cough and shortness of breath.  After you have been seen by a medical provider, you will be either: o Tested for (COVID-19) and discharged home on quarantine except to seek medical care if symptoms worsen, and asked to  - Stay home and avoid contact with others until you get your results (4-5 days)  - Avoid travel on public transportation if possible (such as bus, train, or airplane) or o Sent to the Emergency Department by EMS for evaluation, COVID-19 testing, and possible admission depending on your condition and test results.  What to do if you are LOW RISK for COVID-19?  Reduce your risk of any infection by using the same precautions used for avoiding the common cold or flu:   Wash your hands often with soap and warm water for at least 20 seconds.  If soap and water are not readily available, use an alcohol-based hand sanitizer with at least 60% alcohol.   If coughing or sneezing, cover your mouth and nose by coughing or sneezing into the elbow areas of your shirt or coat, into a tissue or into your sleeve (not your hands).  Avoid shaking hands with others and consider head nods or verbal greetings only.  Avoid touching your eyes, nose, or mouth with unwashed hands.   Avoid close contact with people who are  sick.  Avoid places or events with large numbers of people in one location, like concerts or sporting events.  Carefully consider travel plans you have or are making.  If you are planning any travel outside or inside the [place], visit the [facility] webpage for the latest health notices.  If you have some symptoms but not all symptoms, continue to monitor at home and seek medical attention if your symptoms worsen.  If you are having a medical emergency, call 911.   ADDITIONAL HEALTHCARE OPTIONS FOR PATIENTS  [place] [facility] / e-Visit: [email or web address]         [facility]: [phone]  [facility]: [phone]                   [facility]: [phone]     Safe Medications in Pregnancy   Acne: Benzoyl Peroxide Salicylic Acid  Backache/Headache: Tylenol: 2 regular strength every 4 hours OR              2 Extra strength every 6 hours  Colds/Coughs/Allergies: Benadryl (alcohol free) 25 mg every 6 hours as needed Breath right strips Claritin Cepacol throat  lozenges Chloraseptic throat spray Cold-Eeze- up to three times per day Cough drops, alcohol free Flonase (by prescription only) Guaifenesin Mucinex Robitussin DM (plain only, alcohol free) Saline nasal spray/drops Sudafed (pseudoephedrine) & Actifed ** use only after [redacted] weeks gestation and if you do not have high blood pressure Tylenol Vicks Vaporub Zinc lozenges Zyrtec   Constipation: Colace Ducolax suppositories Fleet enema Glycerin suppositories Metamucil Milk of magnesia Miralax Senokot Smooth move tea  Diarrhea: Kaopectate Imodium A-D  *NO pepto Bismol  Hemorrhoids: Anusol Anusol HC Preparation H Tucks  Indigestion: Tums Maalox Mylanta Zantac  Pepcid  Insomnia: Benadryl (alcohol free)  every 6 hours as needed Tylenol PM Unisom, no Gelcaps  Leg  Cramps: Tums MagGel  Nausea/Vomiting:  Bonine Dramamine Emetrol Ginger extract Sea bands Meclizine  Nausea medication to take during pregnancy:  Unisom (doxylamine succinate 25 mg tablets) Take one tablet daily at bedtime. If symptoms are not adequately controlled, the dose can be increased to a maximum recommended dose of two tablets daily (1/2 tablet in the morning, 1/2 tablet mid-afternoon and one at bedtime). Vitamin B6  tablets. Take one tablet twice a day (up to 200 mg per day).  Skin Rashes: Aveeno products Benadryl cream or  every 6 hours as needed Calamine Lotion 1% cortisone cream  Yeast infection: Gyne-lotrimin 7 Monistat 7   **If taking multiple medications, please check labels to avoid duplicating the same active ingredients **take medication as directed on the label ** Do not exceed 4000 mg of tylenol in 24 hours **Do not take medications that contain aspirin or ibuprofen     Sciatica Rehab Ask your health care provider which exercises are safe for you. Do exercises exactly as told by your health care provider and adjust them as directed. It is normal to feel mild stretching, pulling, tightness, or discomfort as you do these exercises, but you should stop right away if you feel sudden pain or your pain gets worse.Do not begin these exercises until told by your health care provider. Stretching and range of motion exercises These exercises warm up your muscles and joints and improve the movement and flexibility of your hips and your back. These exercises also help to relieve pain, numbness, and tingling. Exercise A: Sciatic nerve glide  1. Sit in a chair with your head facing down toward your chest. Place your hands behind your back. Let your shoulders slump forward. 2. Slowly straighten one of your knees while you tilt your head back as if you are looking toward the ceiling. Only straighten your leg as far as you can without making your symptoms  worse. 3. Hold for __________ seconds. 4. Slowly return to the starting position. 5. Repeat with your other leg. Repeat __________ times. Complete this exercise __________ times a day. Exercise B: Knee to chest with hip adduction and internal rotation    1. Lie on your back on a firm surface with both legs straight. 2. Bend one of your knees and move it up toward your chest until you feel a gentle stretch in your lower back and buttock. Then, move your knee toward the shoulder that is on the opposite side from your leg. ? Hold your leg in this position by holding onto the front of your knee. 3. Hold for __________ seconds. 4. Slowly return to the starting position. 5. Repeat with your other leg. Repeat __________ times. Complete this exercise __________ times a day. Exercise C: Prone extension on elbows    1. Lie on your abdomen  on a firm surface. A bed may be too soft for this exercise. 2. Prop yourself up on your elbows. 3. Use your arms to help lift your chest up until you feel a gentle stretch in your abdomen and your lower back. ? This will place some of your body weight on your elbows. If this is uncomfortable, try stacking pillows under your chest. ? Your hips should stay down, against the surface that you are lying on. Keep your hip and back muscles relaxed. 4. Hold for __________ seconds. 5. Slowly relax your upper body and return to the starting position. Repeat __________ times. Complete this exercise __________ times a day. Strengthening exercises These exercises build strength and endurance in your back. Endurance is the ability to use your muscles for a long time, even after they get tired. Exercise D: Pelvic tilt  1. Lie on your back on a firm surface. Bend your knees and keep your feet flat. 2. Tense your abdominal muscles. Tip your pelvis up toward the ceiling and flatten your lower back into the floor. ? To help with this exercise, you may place a small towel under  your lower back and try to push your back into the towel. 3. Hold for __________ seconds. 4. Let your muscles relax completely before you repeat this exercise. Repeat __________ times. Complete this exercise __________ times a day. Exercise E: Alternating arm and leg raises    1. Get on your hands and knees on a firm surface. If you are on a hard floor, you may want to use padding to cushion your knees, such as an exercise mat. 2. Line up your arms and legs. Your hands should be below your shoulders, and your knees should be below your hips. 3. Lift your left leg behind you. At the same time, raise your right arm and straighten it in front of you. ? Do not lift your leg higher than your hip. ? Do not lift your arm higher than your shoulder. ? Keep your abdominal and back muscles tight. ? Keep your hips facing the ground. ? Do not arch your back. ? Keep your balance carefully, and do not hold your breath. 4. Hold for __________ seconds. 5. Slowly return to the starting position and repeat with your right leg and your left arm. Repeat __________ times. Complete this exercise __________ times a day. Posture and body mechanics    Body mechanics refers to the movements and positions of your body while you do your daily activities. Posture is part of body mechanics. Good posture and healthy body mechanics can help to relieve stress in your body's tissues and joints. Good posture means that your spine is in its natural S-curve position (your spine is neutral), your shoulders are pulled back slightly, and your head is not tipped forward. The following are general guidelines for applying improved posture and body mechanics to your everyday activities. Standing     When standing, keep your spine neutral and your feet about hip-width apart. Keep a slight bend in your knees. Your ears, shoulders, and hips should line up.  When you do a task in which you stand in one place for a long time, place  one foot up on a stable object that is 2-4 inches (5-10 cm) high, such as a footstool. This helps keep your spine neutral. Sitting     When sitting, keep your spine neutral and keep your feet flat on the floor. Use a footrest, if necessary, and keep your  thighs parallel to the floor. Avoid rounding your shoulders, and avoid tilting your head forward.  When working at a desk or a computer, keep your desk at a height where your hands are slightly lower than your elbows. Slide your chair under your desk so you are close enough to maintain good posture.  When working at a computer, place your monitor at a height where you are looking straight ahead and you do not have to tilt your head forward or downward to look at the screen. Resting     When lying down and resting, avoid positions that are most painful for you.  If you have pain with activities such as sitting, bending, stooping, or squatting (flexion-based activities), lie in a position in which your body does not bend very much. For example, avoid curling up on your side with your arms and knees near your chest (fetal position).  If you have pain with activities such as standing for a long time or reaching with your arms (extension-based activities), lie with your spine in a neutral position and bend your knees slightly. Try the following positions: ? Lying on your side with a pillow between your knees. ? Lying on your back with a pillow under your knees. Lifting     When lifting objects, keep your feet at least shoulder-width apart and tighten your abdominal muscles.  Bend your knees and hips and keep your spine neutral. It is important to lift using the strength of your legs, not your back. Do not lock your knees straight out.  Always ask for help to lift heavy or awkward objects. This information is not intended to replace advice given to you by your health care provider. Make sure you discuss any questions you have with your  health care provider. Document Released: 09/27/2005 Document Revised: 06/03/2016 Document Reviewed: 06/13/2015 Elsevier Interactive Patient Education  2017 [employer]

## 2020-07-18 DIAGNOSIS — Z3A12 12 weeks gestation of pregnancy: Secondary | ICD-10-CM | POA: Diagnosis not present

## 2020-07-18 DIAGNOSIS — Z3401 Encounter for supervision of normal first pregnancy, first trimester: Secondary | ICD-10-CM | POA: Diagnosis not present

## 2020-07-18 LAB — CBC/D/PLT+RPR+RH+ABO+RUB AB...
Antibody Screen: NEGATIVE
Basophils Absolute: 0 10*3/uL (ref 0.0–0.2)
Basos: 0 %
EOS (ABSOLUTE): 0.1 10*3/uL (ref 0.0–0.4)
Eos: 1 %
HCV Ab: <0.1 s/co ratio (ref 0.0–0.9)
HIV Screen 4th Generation wRfx: NONREACTIVE
Hematocrit: 36 % (ref 34.0–46.6)
Hemoglobin: 11.9 g/dL (ref 11.1–15.9)
Hepatitis B Surface Ag: NEGATIVE
Immature Grans (Abs): 0 10*3/uL (ref 0.0–0.1)
Immature Granulocytes: 0 %
Lymphocytes Absolute: 2.3 10*3/uL (ref 0.7–3.1)
Lymphs: 22 %
MCH: 27.8 pg (ref 26.6–33.0)
MCHC: 33.1 g/dL (ref 31.5–35.7)
MCV: 84 fL (ref 79–97)
Monocytes Absolute: 0.8 10*3/uL (ref 0.1–0.9)
Monocytes: 8 %
Neutrophils Absolute: 7.2 10*3/uL — ABNORMAL HIGH (ref 1.4–7.0)
Neutrophils: 69 %
Platelets: 295 10*3/uL (ref 150–450)
RBC: 4.28 x10E6/uL (ref 3.77–5.28)
RDW: 13.9 % (ref 11.7–15.4)
RPR Ser Ql: NONREACTIVE
Rh Factor: NEGATIVE
Rubella Antibodies, IGG: 7.22 index (ref >0.99)
WBC: 10.5 10*3/uL (ref 3.4–10.8)

## 2020-07-18 LAB — HCV INTERPRETATION

## 2020-07-20 LAB — PMP SCREEN PROFILE (10S), URINE
BENZODIAZEPINE SCREEN, URINE: NEGATIVE ng/mL
Cocaine (Metab) Scrn, Ur: NEGATIVE ng/mL
OXYCODONE+OXYMORPHONE UR QL SCN: NEGATIVE ng/mL
Phencyclidine Qn, Ur: NEGATIVE ng/mL
Propoxyphene Scrn, Ur: NEGATIVE ng/mL

## 2020-07-20 LAB — MED LIST OPTION NOT SELECTED

## 2020-07-21 LAB — PMP SCREEN PROFILE (10S), URINE
Amphetamine Scrn, Ur: NEGATIVE ng/mL
BARBITURATE SCREEN URINE: NEGATIVE ng/mL
CANNABINOIDS UR QL SCN: NEGATIVE ng/mL
Creatinine(Crt), U: 226.7 mg/dL (ref 20.0–300.0)
Methadone Screen, Urine: NEGATIVE ng/mL
Opiate Scrn, Ur: NEGATIVE ng/mL
Ph of Urine: 6.6 (ref 4.5–8.9)

## 2020-07-22 LAB — GC/CHLAMYDIA PROBE AMP
Chlamydia trachomatis, NAA: NEGATIVE
Neisseria Gonorrhoeae by PCR: NEGATIVE

## 2020-07-23 LAB — URINE CULTURE

## 2020-08-13 ENCOUNTER — Other Ambulatory Visit (HOSPITAL_COMMUNITY)
Admission: RE | Admit: 2020-08-13 | Discharge: 2020-08-13 | Disposition: A | Discharge status: Home / Self Care | Payer: Medicaid Other | Source: Ambulatory Visit | Attending: Advanced Practice Midwife | Admitting: Advanced Practice Midwife

## 2020-08-13 ENCOUNTER — Encounter: Payer: Self-pay | Admitting: Advanced Practice Midwife

## 2020-08-13 ENCOUNTER — Ambulatory Visit (INDEPENDENT_AMBULATORY_CARE_PROVIDER_SITE_OTHER): Payer: Medicaid Other | Admitting: Advanced Practice Midwife

## 2020-08-13 VITALS — BP 135/83 | HR 73 | Wt 268.8 lb

## 2020-08-13 DIAGNOSIS — Z1389 Encounter for screening for other disorder: Secondary | ICD-10-CM

## 2020-08-13 DIAGNOSIS — Z01419 Encounter for gynecological examination (general) (routine) without abnormal findings: Secondary | ICD-10-CM | POA: Insufficient documentation

## 2020-08-13 DIAGNOSIS — Z1151 Encounter for screening for human papillomavirus (HPV): Secondary | ICD-10-CM | POA: Insufficient documentation

## 2020-08-13 DIAGNOSIS — O26899 Other specified pregnancy related conditions, unspecified trimester: Secondary | ICD-10-CM | POA: Insufficient documentation

## 2020-08-13 DIAGNOSIS — Z124 Encounter for screening for malignant neoplasm of cervix: Secondary | ICD-10-CM

## 2020-08-13 DIAGNOSIS — Z331 Pregnant state, incidental: Secondary | ICD-10-CM

## 2020-08-13 DIAGNOSIS — Z363 Encounter for antenatal screening for malformations: Secondary | ICD-10-CM

## 2020-08-13 DIAGNOSIS — Z1379 Encounter for other screening for genetic and chromosomal anomalies: Secondary | ICD-10-CM | POA: Diagnosis not present

## 2020-08-13 DIAGNOSIS — Z3A16 16 weeks gestation of pregnancy: Secondary | ICD-10-CM | POA: Diagnosis not present

## 2020-08-13 DIAGNOSIS — Z6791 Unspecified blood type, Rh negative: Secondary | ICD-10-CM

## 2020-08-13 DIAGNOSIS — Z3402 Encounter for supervision of normal first pregnancy, second trimester: Secondary | ICD-10-CM

## 2020-08-13 LAB — POCT URINALYSIS DIPSTICK OB
Blood, UA: NEGATIVE
Glucose, UA: NEGATIVE
Leukocytes, UA: NEGATIVE
Nitrite, UA: NEGATIVE
POC,PROTEIN,UA: NEGATIVE

## 2020-08-13 NOTE — Progress Notes (Signed)
   LOW-RISK PREGNANCY VISIT Patient name: Rebecca Frey MRN [id number]  Date of birth: Jan 20, 1991 Chief Complaint:   Routine Prenatal Visit  History of Present Illness:   Rebecca Frey is a 29 y.o. G1P0 female at [redacted]w[redacted]d with an Estimated Date of Delivery: 01/23/21 being seen today for ongoing management of a low-risk pregnancy.  Today she reports doing well; shared that her husband has club feet so she is a little anxious about her anatomy u/s . Contractions: Not present. Vag. Bleeding: None.  Movement: Present. denies leaking of fluid. Review of Systems:   Pertinent items are noted in HPI Denies abnormal vaginal discharge w/ itching/odor/irritation, headaches, visual changes, shortness of breath, chest pain, abdominal pain, severe nausea/vomiting, or problems with urination or bowel movements unless otherwise stated above. Pertinent History Reviewed:  Reviewed past medical,surgical, social, obstetrical and family history.  Reviewed problem list, medications and allergies. Physical Assessment:   Vitals:   08/13/20 0941  BP: 135/83  Pulse: 73  Weight: 268 lb 12.8 oz (121.9 kg)  Body mass index is 46.14 kg/m.        Physical Examination:   General appearance: Well appearing, and in no distress  Mental status: Alert, oriented to person, place, and time  Skin: Warm & dry  Cardiovascular: Normal heart rate noted  Respiratory: Normal respiratory effort, no distress  Abdomen: Soft, gravid, nontender  Pelvic: Cervical exam deferred         Extremities: Edema: Trace  Fetal Status: Fetal Heart Rate (bpm): 136   Movement: Present    Results for orders placed or performed in visit on 08/13/20 (from the past 24 hour(s))  POC Urinalysis Dipstick OB   Collection Time: 08/13/20  9:38 AM  Result Value Ref Range   Color, UA     Clarity, UA     Glucose, UA Negative Negative   Bilirubin, UA     Ketones, UA small    Spec Grav, UA     Blood, UA neg    pH, UA     POC,PROTEIN,UA Negative  Negative, Trace, Small (1+), Moderate (2+), Large (3+), 4+   Urobilinogen, UA     Nitrite, UA neg    Leukocytes, UA Negative Negative   Appearance     Odor      Assessment & Plan:  1) Low-risk pregnancy G1P0 at [redacted]w[redacted]d with an Estimated Date of Delivery: 01/23/21   2) Rh neg, reviewed recommended Rhogam with bleeding & at 28wks & PP eval  3) Spouse w bilat club feet, anxious re anatomy u/s   Meds: No orders of the defined types were placed in this encounter.  Labs/procedures today: AFP  Plan:  Continue routine obstetrical care   Reviewed: Preterm labor symptoms and general obstetric precautions including but not limited to vaginal bleeding, contractions, leaking of fluid and fetal movement were reviewed in detail with the patient.  All questions were answered. Didn't ask about home bp cuff. Check bp weekly, let us know if >140/90.   Follow-up: Return in about 3 weeks (around 09/03/2020) for LROB, [place]: Anatomy, in person.  Orders Placed This Encounter  Procedures  . US OB Comp + 14 Wk  . AFP TETRA  . POC Urinalysis Dipstick OB   Arabella Merles [facility] 08/13/2020 10:25 AM

## 2020-08-13 NOTE — Patient Instructions (Signed)
Rebecca Frey, I greatly value your feedback.  If you receive a survey following your visit with [place] today, we appreciate you taking the time to fill it out.  Thanks, Philipp Deputy, CNM  [facility] ([address] [place], [place] [address]) Entrance C, located off of [address] valet parking  Go to [email or web address] to register for FREE online childbirth classes  [place] Pediatricians/Family Doctors:  [facility] [phone]            [facility] [phone]                 [facility] [phone] (usually not accepting new patients unless you have family there already, you are always welcome to call and ask)       [facility] Department [phone]       [facility] Pediatricians/Family Doctors:   [facility]: [phone]  [facility]: [phone]  [facility]: [phone]  [facility] Doctors:   [facility]: [phone]   [facility]: [phone]  [facility] Doctors:  [facility]: [phone]    Home Blood Pressure Monitoring for Patients   Your provider has recommended that you check your blood pressure (BP) at least once a week at home. If you do not have a blood pressure cuff at home, one will be provided for you. Contact your provider if you have not received your monitor within 1 week.   Helpful Tips for Accurate Home Blood Pressure Checks  . Don't smoke, exercise, or drink caffeine 30 minutes before checking your BP . Use the restroom before checking your BP (a full bladder can raise your pressure) . Relax in a comfortable upright chair . Feet on the ground . Left arm resting comfortably on a flat surface at the level of your heart . Legs uncrossed . Back supported . Sit quietly and don't talk . Place the cuff on your bare arm . Adjust snuggly, so that only two  fingertips can fit between your skin and the top of the cuff . Check 2 readings separated by at least one minute . Keep a log of your BP readings . For a visual, please reference this diagram: http://ccnc.care/bpdiagram  Provider Name: [facility]     Phone: [phone]  Zone 1: ALL CLEAR  Continue to monitor your symptoms:  . BP reading is less than 140 (top number) or less than 90 (bottom number)  . No right upper stomach pain . No headaches or seeing spots . No feeling nauseated or throwing up . No swelling in face and hands  Zone 2: CAUTION Call your doctor's office for any of the following:  . BP reading is greater than 140 (top number) or greater than 90 (bottom number)  . Stomach pain under your ribs in the middle or right side . Headaches or seeing spots . Feeling nauseated or throwing up . Swelling in face and hands  Zone 3: EMERGENCY  Seek immediate medical care if you have any of the following:  . BP reading is greater than160 (top number) or greater than 110 (bottom number) . Severe headaches not improving with Tylenol . Serious difficulty catching your breath . Any worsening symptoms from Zone 2     Second Trimester of Pregnancy The second trimester is from week 14 through week 27 (months 4 through 6). The second trimester is often a time when you feel your best. Your  body has adjusted to being pregnant, and you begin to feel better physically. Usually, morning sickness has lessened or quit completely, you may have more energy, and you may have an increase in appetite. The second trimester is also a time when the fetus is growing rapidly. At the end of the sixth month, the fetus is about 9 inches long and weighs about 1 pounds. You will likely begin to feel the baby move (quickening) between 16 and 20 weeks of pregnancy. Body changes during your second trimester Your body continues to go through many changes during your second trimester. The changes vary from  woman to woman.  Your weight will continue to increase. You will notice your lower abdomen bulging out.  You may begin to get stretch marks on your hips, abdomen, and breasts.  You may develop headaches that can be relieved by medicines. The medicines should be approved by your health care provider.  You may urinate more often because the fetus is pressing on your bladder.  You may develop or continue to have heartburn as a result of your pregnancy.  You may develop constipation because certain hormones are causing the muscles that push waste through your intestines to slow down.  You may develop hemorrhoids or swollen, bulging veins (varicose veins).  You may have back pain. This is caused by: ? Weight gain. ? Pregnancy hormones that are relaxing the joints in your pelvis. ? A shift in weight and the muscles that support your balance.  Your breasts will continue to grow and they will continue to become tender.  Your gums may bleed and may be sensitive to brushing and flossing.  Dark spots or blotches (chloasma, mask of pregnancy) may develop on your face. This will likely fade after the baby is born.  A dark line from your belly button to the pubic area (linea nigra) may appear. This will likely fade after the baby is born.  You may have changes in your hair. These can include thickening of your hair, rapid growth, and changes in texture. Some women also have hair loss during or after pregnancy, or hair that feels dry or thin. Your hair will most likely return to normal after your baby is born.  What to expect at prenatal visits During a routine prenatal visit:  You will be weighed to make sure you and the fetus are growing normally.  Your blood pressure will be taken.  Your abdomen will be measured to track your baby's growth.  The fetal heartbeat will be listened to.  Any test results from the previous visit will be discussed.  Your health care provider may ask  you:  How you are feeling.  If you are feeling the baby move.  If you have had any abnormal symptoms, such as leaking fluid, bleeding, severe headaches, or abdominal cramping.  If you are using any tobacco products, including cigarettes, chewing tobacco, and electronic cigarettes.  If you have any questions.  Other tests that may be performed during your second trimester include:  Blood tests that check for: ? Low iron levels (anemia). ? High blood sugar that affects pregnant women (gestational diabetes) between 64 and 28 weeks. ? Rh antibodies. This is to check for a protein on red blood cells (Rh factor).  Urine tests to check for infections, diabetes, or protein in the urine.  An ultrasound to confirm the proper growth and development of the baby.  An amniocentesis to check for possible genetic problems.  Fetal screens for  spina bifida and Down syndrome.  HIV (human immunodeficiency virus) testing. Routine prenatal testing includes screening for HIV, unless you choose not to have this test.  Follow these instructions at home: Medicines  Follow your health care provider's instructions regarding medicine use. Specific medicines may be either safe or unsafe to take during pregnancy.  Take a prenatal vitamin that contains at least 600 micrograms (mcg) of folic acid.  If you develop constipation, try taking a stool softener if your health care provider approves. Eating and drinking  Eat a balanced diet that includes fresh fruits and vegetables, whole grains, good sources of protein such as meat, eggs, or tofu, and low-fat dairy. Your health care provider will help you determine the amount of weight gain that is right for you.  Avoid raw meat and uncooked cheese. These carry germs that can cause birth defects in the baby.  If you have low calcium intake from food, talk to your health care provider about whether you should take a daily calcium supplement.  Limit foods that  are high in fat and processed sugars, such as fried and sweet foods.  To prevent constipation: ? Drink enough fluid to keep your urine clear or pale yellow. ? Eat foods that are high in fiber, such as fresh fruits and vegetables, whole grains, and beans. Activity  Exercise only as directed by your health care provider. Most women can continue their usual exercise routine during pregnancy. Try to exercise for 30 minutes at least 5 days a week. Stop exercising if you experience uterine contractions.  Avoid heavy lifting, wear low heel shoes, and practice good posture.  A sexual relationship may be continued unless your health care provider directs you otherwise. Relieving pain and discomfort  Wear a good support bra to prevent discomfort from breast tenderness.  Take warm sitz baths to soothe any pain or discomfort caused by hemorrhoids. Use hemorrhoid cream if your health care provider approves.  Rest with your legs elevated if you have leg cramps or low back pain.  If you develop varicose veins, wear support hose. Elevate your feet for 15 minutes, 3-4 times a day. Limit salt in your diet. Prenatal Care  Write down your questions. Take them to your prenatal visits.  Keep all your prenatal visits as told by your health care provider. This is important. Safety  Wear your seat belt at all times when driving.  Make a list of emergency phone numbers, including numbers for family, friends, the hospital, and police and fire departments. General instructions  Ask your health care provider for a referral to a local prenatal education class. Begin classes no later than the beginning of month 6 of your pregnancy.  Ask for help if you have counseling or nutritional needs during pregnancy. Your health care provider can offer advice or refer you to specialists for help with various needs.  Do not use hot tubs, steam rooms, or saunas.  Do not douche or use tampons or scented sanitary  pads.  Do not cross your legs for long periods of time.  Avoid cat litter boxes and soil used by cats. These carry germs that can cause birth defects in the baby and possibly loss of the fetus by miscarriage or stillbirth.  Avoid all smoking, herbs, alcohol, and unprescribed drugs. Chemicals in these products can affect the formation and growth of the baby.  Do not use any products that contain nicotine or tobacco, such as cigarettes and e-cigarettes. If you need help quitting, ask  your health care provider.  Visit your dentist if you have not gone yet during your pregnancy. Use a soft toothbrush to brush your teeth and be gentle when you floss. Contact a health care provider if:  You have dizziness.  You have mild pelvic cramps, pelvic pressure, or nagging pain in the abdominal area.  You have persistent nausea, vomiting, or diarrhea.  You have a bad smelling vaginal discharge.  You have pain when you urinate. Get help right away if:  You have a fever.  You are leaking fluid from your vagina.  You have spotting or bleeding from your vagina.  You have severe abdominal cramping or pain.  You have rapid weight gain or weight loss.  You have shortness of breath with chest pain.  You notice sudden or extreme swelling of your face, hands, ankles, feet, or legs.  You have not felt your baby move in over an hour.  You have severe headaches that do not go away when you take medicine.  You have vision changes. Summary  The second trimester is from week 14 through week 27 (months 4 through 6). It is also a time when the fetus is growing rapidly.  Your body goes through many changes during pregnancy. The changes vary from woman to woman.  Avoid all smoking, herbs, alcohol, and unprescribed drugs. These chemicals affect the formation and growth your baby.  Do not use any tobacco products, such as cigarettes, chewing tobacco, and e-cigarettes. If you need help quitting, ask your  health care provider.  Contact your health care provider if you have any questions. Keep all prenatal visits as told by your health care provider. This is important. This information is not intended to replace advice given to you by your health care provider. Make sure you discuss any questions you have with your health care provider. Document Released: 09/21/2001 Document Revised: 03/04/2016 Document Reviewed: 11/28/2012 Elsevier Interactive Patient Education  2017 [employer].

## 2020-08-15 LAB — AFP TETRA
DIA Mom Value: 1.38
DIA Value (EIA): 158.34 pg/mL
DSR (By Age)    1 IN: 797
DSR (Second Trimester) 1 IN: 10000
Gestational Age: 16.5 WEEKS
MSAFP Mom: 4.07
MSAFP: 104.3 ng/mL
MSHCG Mom: 1.4
MSHCG: 36178 m[IU]/mL
Maternal Age At EDD: 28.8 yr
Osb Risk: 24
T18 (By Age): 1:3104 {titer}
Test Results:: POSITIVE — AB
Weight: 268 [lb_av]
uE3 Mom: 1.62
uE3 Value: 1.49 ng/mL

## 2020-08-18 LAB — CYTOLOGY - PAP
Comment: NEGATIVE
Diagnosis: NEGATIVE
High risk HPV: NEGATIVE

## 2020-08-20 ENCOUNTER — Encounter: Payer: Self-pay | Admitting: Women's Health

## 2020-08-20 ENCOUNTER — Ambulatory Visit (INDEPENDENT_AMBULATORY_CARE_PROVIDER_SITE_OTHER): Payer: Medicaid Other

## 2020-08-20 ENCOUNTER — Encounter: Payer: Self-pay | Admitting: *Deleted

## 2020-08-20 DIAGNOSIS — Z3402 Encounter for supervision of normal first pregnancy, second trimester: Secondary | ICD-10-CM

## 2020-08-20 DIAGNOSIS — Z363 Encounter for antenatal screening for malformations: Secondary | ICD-10-CM | POA: Diagnosis not present

## 2020-08-20 DIAGNOSIS — Z6791 Unspecified blood type, Rh negative: Secondary | ICD-10-CM

## 2020-08-20 DIAGNOSIS — O26899 Other specified pregnancy related conditions, unspecified trimester: Secondary | ICD-10-CM

## 2020-08-20 NOTE — Progress Notes (Signed)
[place] 17+5 wks,breech,cx 3.4 cm,anterior placenta low lying,tip of placenta to cx 1 cm,Svp of fluid 3.4 cm,fhr 154 bpm,EFW 199 g 34%,FL 10%,anatomy complete,will have pt come back on 11/29 for TV ultrasound to recheck placenta

## 2020-09-08 ENCOUNTER — Other Ambulatory Visit: Payer: Medicaid Other

## 2020-09-08 ENCOUNTER — Encounter: Payer: Medicaid Other | Admitting: Advanced Practice Midwife

## 2020-09-09 ENCOUNTER — Other Ambulatory Visit: Payer: Self-pay | Admitting: Advanced Practice Midwife

## 2020-09-09 DIAGNOSIS — O4442 Low lying placenta NOS or without hemorrhage, second trimester: Secondary | ICD-10-CM

## 2020-09-10 ENCOUNTER — Encounter: Payer: Self-pay | Admitting: Advanced Practice Midwife

## 2020-09-10 ENCOUNTER — Ambulatory Visit (INDEPENDENT_AMBULATORY_CARE_PROVIDER_SITE_OTHER): Payer: Medicaid Other | Admitting: Advanced Practice Midwife

## 2020-09-10 ENCOUNTER — Ambulatory Visit (INDEPENDENT_AMBULATORY_CARE_PROVIDER_SITE_OTHER): Payer: Medicaid Other

## 2020-09-10 ENCOUNTER — Other Ambulatory Visit: Payer: Self-pay

## 2020-09-10 VITALS — BP 128/74 | HR 72 | Wt 264.6 lb

## 2020-09-10 DIAGNOSIS — Z3A20 20 weeks gestation of pregnancy: Secondary | ICD-10-CM

## 2020-09-10 DIAGNOSIS — Z3402 Encounter for supervision of normal first pregnancy, second trimester: Secondary | ICD-10-CM

## 2020-09-10 DIAGNOSIS — Z331 Pregnant state, incidental: Secondary | ICD-10-CM

## 2020-09-10 DIAGNOSIS — O4442 Low lying placenta NOS or without hemorrhage, second trimester: Secondary | ICD-10-CM

## 2020-09-10 DIAGNOSIS — O4440 Low lying placenta NOS or without hemorrhage, unspecified trimester: Secondary | ICD-10-CM | POA: Insufficient documentation

## 2020-09-10 DIAGNOSIS — O26899 Other specified pregnancy related conditions, unspecified trimester: Secondary | ICD-10-CM

## 2020-09-10 DIAGNOSIS — Z1389 Encounter for screening for other disorder: Secondary | ICD-10-CM

## 2020-09-10 DIAGNOSIS — Z6791 Unspecified blood type, Rh negative: Secondary | ICD-10-CM

## 2020-09-10 DIAGNOSIS — R772 Abnormality of alphafetoprotein: Secondary | ICD-10-CM | POA: Insufficient documentation

## 2020-09-10 LAB — POCT URINALYSIS DIPSTICK OB
Blood, UA: NEGATIVE
Glucose, UA: NEGATIVE
Ketones, UA: NEGATIVE
Leukocytes, UA: NEGATIVE
Nitrite, UA: NEGATIVE
POC,PROTEIN,UA: NEGATIVE

## 2020-09-10 NOTE — Patient Instructions (Signed)
Rebecca Frey, I greatly value your feedback.  If you receive a survey following your visit with us today, we appreciate you taking the time to fill it out.  Thanks, Kim Rakeem Colley, CNM  [facility] ([address] [place], [place] [address]) Entrance C, located off of [address] Free 24/7 valet parking  Go to [email or web address] to register for FREE online childbirth classes  [place] Pediatricians/Family Doctors:  [facility] [phone]            [facility] [phone]                 [facility] [phone] (usually not accepting new patients unless you have family there already, you are always welcome to call and ask)       [facility] [phone]       [place] Pediatricians/Family Doctors:   [facility]: [phone]  [facility]: [phone]  [facility] [place]: [phone]  [place] Family Doctors:   [facility]: [phone]   [facility]: [phone]  [place] Family Doctors:  [facility]: [phone]    Home Blood Pressure Monitoring for Patients   Your provider has recommended that you check your blood pressure (BP) at least once a week at home. If you do not have a blood pressure cuff at home, one will be provided for you. Contact your provider if you have not received your monitor within 1 week.   Helpful Tips for Accurate Home Blood Pressure Checks  . Don't smoke, exercise, or drink caffeine 30 minutes before checking your BP . Use the restroom before checking your BP (a full bladder can raise your pressure) . Relax in a comfortable upright chair . Feet on the ground . Left arm resting comfortably on a flat surface at the level of your heart . Legs uncrossed . Back supported . Sit quietly and don't talk . Place the cuff on your bare arm . Adjust snuggly, so that only two  fingertips can fit between your skin and the top of the cuff . Check 2 readings separated by at least one minute . Keep a log of your BP readings . For a visual, please reference this diagram: http://ccnc.care/bpdiagram  Provider Name: [facility]     Phone: [phone]  Zone 1: ALL CLEAR  Continue to monitor your symptoms:  . BP reading is less than 140 (top number) or less than 90 (bottom number)  . No right upper stomach pain . No headaches or seeing spots . No feeling nauseated or throwing up . No swelling in face and hands  Zone 2: CAUTION Call your doctor's office for any of the following:  . BP reading is greater than 140 (top number) or greater than 90 (bottom number)  . Stomach pain under your ribs in the middle or right side . Headaches or seeing spots . Feeling nauseated or throwing up . Swelling in face and hands  Zone 3: EMERGENCY  Seek immediate medical care if you have any of the following:  . BP reading is greater than160 (top number) or greater than 110 (bottom number) . Severe headaches not improving with Tylenol . Serious difficulty catching your breath . Any worsening symptoms from Zone 2     Second Trimester of Pregnancy The second trimester is from week 14 through week 27 (months 4 through 6). The second trimester is often a time when you feel your best. Your   body has adjusted to being pregnant, and you begin to feel better physically. Usually, morning sickness has lessened or quit completely, you may have more energy, and you may have an increase in appetite. The second trimester is also a time when the fetus is growing rapidly. At the end of the sixth month, the fetus is about 9 inches long and weighs about 1 pounds. You will likely begin to feel the baby move (quickening) between 16 and 20 weeks of pregnancy. Body changes during your second trimester Your body continues to go through many changes during your second trimester. The changes vary from  woman to woman.  Your weight will continue to increase. You will notice your lower abdomen bulging out.  You may begin to get stretch marks on your hips, abdomen, and breasts.  You may develop headaches that can be relieved by medicines. The medicines should be approved by your health care provider.  You may urinate more often because the fetus is pressing on your bladder.  You may develop or continue to have heartburn as a result of your pregnancy.  You may develop constipation because certain hormones are causing the muscles that push waste through your intestines to slow down.  You may develop hemorrhoids or swollen, bulging veins (varicose veins).  You may have back pain. This is caused by: ? Weight gain. ? Pregnancy hormones that are relaxing the joints in your pelvis. ? A shift in weight and the muscles that support your balance.  Your breasts will continue to grow and they will continue to become tender.  Your gums may bleed and may be sensitive to brushing and flossing.  Dark spots or blotches (chloasma, mask of pregnancy) may develop on your face. This will likely fade after the baby is born.  A dark line from your belly button to the pubic area (linea nigra) may appear. This will likely fade after the baby is born.  You may have changes in your hair. These can include thickening of your hair, rapid growth, and changes in texture. Some women also have hair loss during or after pregnancy, or hair that feels dry or thin. Your hair will most likely return to normal after your baby is born.  What to expect at prenatal visits During a routine prenatal visit:  You will be weighed to make sure you and the fetus are growing normally.  Your blood pressure will be taken.  Your abdomen will be measured to track your baby's growth.  The fetal heartbeat will be listened to.  Any test results from the previous visit will be discussed.  Your health care provider may ask  you:  How you are feeling.  If you are feeling the baby move.  If you have had any abnormal symptoms, such as leaking fluid, bleeding, severe headaches, or abdominal cramping.  If you are using any tobacco products, including cigarettes, chewing tobacco, and electronic cigarettes.  If you have any questions.  Other tests that may be performed during your second trimester include:  Blood tests that check for: ? Low iron levels (anemia). ? High blood sugar that affects pregnant women (gestational diabetes) between 64 and 28 weeks. ? Rh antibodies. This is to check for a protein on red blood cells (Rh factor).  Urine tests to check for infections, diabetes, or protein in the urine.  An ultrasound to confirm the proper growth and development of the baby.  An amniocentesis to check for possible genetic problems.  Fetal screens for  spina bifida and Down syndrome.  HIV (human immunodeficiency virus) testing. Routine prenatal testing includes screening for HIV, unless you choose not to have this test.  Follow these instructions at home: Medicines  Follow your health care provider's instructions regarding medicine use. Specific medicines may be either safe or unsafe to take during pregnancy.  Take a prenatal vitamin that contains at least 600 micrograms (mcg) of folic acid.  If you develop constipation, try taking a stool softener if your health care provider approves. Eating and drinking  Eat a balanced diet that includes fresh fruits and vegetables, whole grains, good sources of protein such as meat, eggs, or tofu, and low-fat dairy. Your health care provider will help you determine the amount of weight gain that is right for you.  Avoid raw meat and uncooked cheese. These carry germs that can cause birth defects in the baby.  If you have low calcium intake from food, talk to your health care provider about whether you should take a daily calcium supplement.  Limit foods that  are high in fat and processed sugars, such as fried and sweet foods.  To prevent constipation: ? Drink enough fluid to keep your urine clear or pale yellow. ? Eat foods that are high in fiber, such as fresh fruits and vegetables, whole grains, and beans. Activity  Exercise only as directed by your health care provider. Most women can continue their usual exercise routine during pregnancy. Try to exercise for 30 minutes at least 5 days a week. Stop exercising if you experience uterine contractions.  Avoid heavy lifting, wear low heel shoes, and practice good posture.  A sexual relationship may be continued unless your health care provider directs you otherwise. Relieving pain and discomfort  Wear a good support bra to prevent discomfort from breast tenderness.  Take warm sitz baths to soothe any pain or discomfort caused by hemorrhoids. Use hemorrhoid cream if your health care provider approves.  Rest with your legs elevated if you have leg cramps or low back pain.  If you develop varicose veins, wear support hose. Elevate your feet for 15 minutes, 3-4 times a day. Limit salt in your diet. Prenatal Care  Write down your questions. Take them to your prenatal visits.  Keep all your prenatal visits as told by your health care provider. This is important. Safety  Wear your seat belt at all times when driving.  Make a list of emergency phone numbers, including numbers for family, friends, the hospital, and police and fire departments. General instructions  Ask your health care provider for a referral to a local prenatal education class. Begin classes no later than the beginning of month 6 of your pregnancy.  Ask for help if you have counseling or nutritional needs during pregnancy. Your health care provider can offer advice or refer you to specialists for help with various needs.  Do not use hot tubs, steam rooms, or saunas.  Do not douche or use tampons or scented sanitary  pads.  Do not cross your legs for long periods of time.  Avoid cat litter boxes and soil used by cats. These carry germs that can cause birth defects in the baby and possibly loss of the fetus by miscarriage or stillbirth.  Avoid all smoking, herbs, alcohol, and unprescribed drugs. Chemicals in these products can affect the formation and growth of the baby.  Do not use any products that contain nicotine or tobacco, such as cigarettes and e-cigarettes. If you need help quitting, ask  your health care provider.  Visit your dentist if you have not gone yet during your pregnancy. Use a soft toothbrush to brush your teeth and be gentle when you floss. Contact a health care provider if:  You have dizziness.  You have mild pelvic cramps, pelvic pressure, or nagging pain in the abdominal area.  You have persistent nausea, vomiting, or diarrhea.  You have a bad smelling vaginal discharge.  You have pain when you urinate. Get help right away if:  You have a fever.  You are leaking fluid from your vagina.  You have spotting or bleeding from your vagina.  You have severe abdominal cramping or pain.  You have rapid weight gain or weight loss.  You have shortness of breath with chest pain.  You notice sudden or extreme swelling of your face, hands, ankles, feet, or legs.  You have not felt your baby move in over an hour.  You have severe headaches that do not go away when you take medicine.  You have vision changes. Summary  The second trimester is from week 14 through week 27 (months 4 through 6). It is also a time when the fetus is growing rapidly.  Your body goes through many changes during pregnancy. The changes vary from woman to woman.  Avoid all smoking, herbs, alcohol, and unprescribed drugs. These chemicals affect the formation and growth your baby.  Do not use any tobacco products, such as cigarettes, chewing tobacco, and e-cigarettes. If you need help quitting, ask your  health care provider.  Contact your health care provider if you have any questions. Keep all prenatal visits as told by your health care provider. This is important. This information is not intended to replace advice given to you by your health care provider. Make sure you discuss any questions you have with your health care provider. Document Released: 09/21/2001 Document Revised: 03/04/2016 Document Reviewed: 11/28/2012 Elsevier Interactive Patient Education  2017 [employer].

## 2020-09-10 NOTE — Progress Notes (Signed)
[place] 20+5 wks,breech,anterior low lying placenta,tip of placenta to cx 1.4 cm,cx length 2.9 cm,svp of fluid  5.7 cm,normal ovaries,fhr 138 bpm,EFW 328 g 15%,FL 4%

## 2020-09-10 NOTE — Progress Notes (Signed)
   LOW-RISK PREGNANCY VISIT Patient name: Rebecca Frey MRN [id number]  Date of birth: [birth year] Chief Complaint:   Routine Prenatal Visit  History of Present Illness:   Rebecca Frey is a 29 y.o. G1P0 female at [redacted]w[redacted]d with an Estimated Date of Delivery: 01/23/21 being seen today for ongoing management of a low-risk pregnancy.  Today she reports increased nausea this week, esp at work [employer] conservation [employer]). Contractions: Not present. Vag. Bleeding: None.  Movement: Present. denies leaking of fluid. Review of Systems:   Pertinent items are noted in HPI Denies abnormal vaginal discharge w/ itching/odor/irritation, headaches, visual changes, shortness of breath, chest pain, abdominal pain, severe nausea/vomiting, or problems with urination or bowel movements unless otherwise stated above. Pertinent History Reviewed:  Reviewed past medical,surgical, social, obstetrical and family history.  Reviewed problem list, medications and allergies. Physical Assessment:   Vitals:   09/10/20 1517  BP: 128/74  Pulse: 72  Weight: 264 lb 9.6 oz (120 kg)  Body mass index is 45.42 kg/m.        Physical Examination:   General appearance: Well appearing, and in no distress  Mental status: Alert, oriented to person, place, and time  Skin: Warm & dry  Cardiovascular: Normal heart rate noted  Respiratory: Normal respiratory effort, no distress  Abdomen: Soft, gravid, nontender  Pelvic: Cervical exam deferred         Extremities: Edema: Trace  Fetal Status: Fetal Heart Rate (bpm): 138 u/s   Movement: Present     U/S for f/u of placenta:  [place] 20+5 wks,breech,anterior low lying placenta,tip of placenta to cx 1.4 cm,cx length 2.9 cm,svp of fluid  5.7 cm,normal ovaries,fhr 138 bpm,EFW 328 g 15%,FL 4%  Results for orders placed or performed in visit on 09/10/20 (from the past 24 hour(s))  POC Urinalysis Dipstick OB   Collection Time: 09/10/20  3:16 PM  Result Value Ref Range   Color, UA     Clarity, UA      Glucose, UA Negative Negative   Bilirubin, UA     Ketones, UA neg    Spec Grav, UA     Blood, UA neg    pH, UA     POC,PROTEIN,UA Negative Negative, Trace, Small (1+), Moderate (2+), Large (3+), 4+   Urobilinogen, UA     Nitrite, UA neg    Leukocytes, UA Negative Negative   Appearance     Odor      Assessment & Plan:  1) Low-risk pregnancy G1P0 at [redacted]w[redacted]d with an Estimated Date of Delivery: 01/23/21   2) +AFP (4.07 Mom) , nl anatomy scan- will f/u w Dr Despina Hidden for any additional recs  3) Low lying placenta, 1.4cm from cx; will f/u at 28wks   Meds: No orders of the defined types were placed in this encounter.  Labs/procedures today: f/u scan for placenta  Plan:  Continue routine obstetrical care   Reviewed: Preterm labor symptoms and general obstetric precautions including but not limited to vaginal bleeding, contractions, leaking of fluid and fetal movement were reviewed in detail with the patient.  All questions were answered. Will get home bp cuff today. Check bp weekly, let us know if >140/90.   Follow-up: Return in about 4 weeks (around 10/08/2020) for LROB, in person.  Orders Placed This Encounter  Procedures  . POC Urinalysis Dipstick OB   Arabella Merles [facility] 09/10/2020 3:35 PM

## 2020-09-12 DIAGNOSIS — Z3481 Encounter for supervision of other normal pregnancy, first trimester: Secondary | ICD-10-CM | POA: Diagnosis not present

## 2020-09-12 DIAGNOSIS — Z3490 Encounter for supervision of normal pregnancy, unspecified, unspecified trimester: Secondary | ICD-10-CM | POA: Diagnosis not present

## 2020-10-07 ENCOUNTER — Other Ambulatory Visit: Payer: Self-pay | Admitting: Advanced Practice Midwife

## 2020-10-07 DIAGNOSIS — O4442 Low lying placenta NOS or without hemorrhage, second trimester: Secondary | ICD-10-CM

## 2020-10-08 ENCOUNTER — Ambulatory Visit (INDEPENDENT_AMBULATORY_CARE_PROVIDER_SITE_OTHER): Payer: Medicaid Other

## 2020-10-08 ENCOUNTER — Other Ambulatory Visit: Payer: Self-pay

## 2020-10-08 ENCOUNTER — Ambulatory Visit (INDEPENDENT_AMBULATORY_CARE_PROVIDER_SITE_OTHER): Payer: Medicaid Other | Admitting: Advanced Practice Midwife

## 2020-10-08 VITALS — BP 144/94 | HR 76 | Wt 268.2 lb

## 2020-10-08 DIAGNOSIS — O162 Unspecified maternal hypertension, second trimester: Secondary | ICD-10-CM | POA: Diagnosis not present

## 2020-10-08 DIAGNOSIS — R772 Abnormality of alphafetoprotein: Secondary | ICD-10-CM

## 2020-10-08 DIAGNOSIS — O26899 Other specified pregnancy related conditions, unspecified trimester: Secondary | ICD-10-CM

## 2020-10-08 DIAGNOSIS — O4440 Low lying placenta NOS or without hemorrhage, unspecified trimester: Secondary | ICD-10-CM

## 2020-10-08 DIAGNOSIS — R030 Elevated blood-pressure reading, without diagnosis of hypertension: Secondary | ICD-10-CM

## 2020-10-08 DIAGNOSIS — O10919 Unspecified pre-existing hypertension complicating pregnancy, unspecified trimester: Secondary | ICD-10-CM | POA: Insufficient documentation

## 2020-10-08 DIAGNOSIS — Z3A24 24 weeks gestation of pregnancy: Secondary | ICD-10-CM

## 2020-10-08 DIAGNOSIS — Z3402 Encounter for supervision of normal first pregnancy, second trimester: Secondary | ICD-10-CM

## 2020-10-08 DIAGNOSIS — O4442 Low lying placenta NOS or without hemorrhage, second trimester: Secondary | ICD-10-CM | POA: Diagnosis not present

## 2020-10-08 DIAGNOSIS — Z1389 Encounter for screening for other disorder: Secondary | ICD-10-CM

## 2020-10-08 DIAGNOSIS — O365990 Maternal care for other known or suspected poor fetal growth, unspecified trimester, not applicable or unspecified: Secondary | ICD-10-CM | POA: Insufficient documentation

## 2020-10-08 DIAGNOSIS — Z331 Pregnant state, incidental: Secondary | ICD-10-CM

## 2020-10-08 LAB — POCT URINALYSIS DIPSTICK OB
Blood, UA: NEGATIVE
Glucose, UA: NEGATIVE
Ketones, UA: NEGATIVE
Leukocytes, UA: NEGATIVE
Nitrite, UA: NEGATIVE
POC,PROTEIN,UA: NEGATIVE

## 2020-10-08 MED ORDER — ASPIRIN 81 MG PO CHEW: 162.0000 mg | CHEWABLE_TABLET | Freq: Every day | ORAL | 4 refills | Status: DC

## 2020-10-08 NOTE — Progress Notes (Signed)
HIGH-RISK PREGNANCY VISIT Patient name: Rebecca Frey MRN [id number]  Date of birth: [birth year] Chief Complaint:   Routine Prenatal Visit  History of Present Illness:   Rebecca Frey is a 29 y.o. G1P0 female at 60w5dwith an Estimated Date of Delivery: 01/23/21 being seen today for ongoing management of a high-risk pregnancy complicated by fetal growth restriction 7% (FL 1%, the others between 19-60%); nl dopplers and fluid.  Today she reports feeling stressed over losing her job; denies s/s pre-e.  [facility] 07/17[facility]2020 06/19/2020  Decreased Interest 2 1  Down, Depressed, Hopeless 1 0  PHQ - 2 Score 3 1  Altered sleeping 2 3  Tired, decreased energy 2 3  Change in appetite 3 2  Feeling bad or failure about yourself  0 0  Trouble concentrating 3 3  Moving slowly or fidgety/restless 0 0  Suicidal thoughts 0 0  PHQ-9 Score 13 12  Difficult doing work/chores - Not difficult at all    Contractions: Not present. Vag. Bleeding: None.  Movement: Present. denies leaking of fluid.  Review of Systems:   Pertinent items are noted in HPI Denies abnormal vaginal discharge w/ itching/odor/irritation, headaches, visual changes, shortness of breath, chest pain, abdominal pain, severe nausea/vomiting, or problems with urination or bowel movements unless otherwise stated above. Pertinent History Reviewed:  Reviewed past medical,surgical, social, obstetrical and family history.  Reviewed problem list, medications and allergies. Physical Assessment:   Vitals:   10/08/20 1507 10/08/20 1513 10/08/20 1514  BP: (!) 149/89 (!) 143/92 (!) 144/94  Pulse: 76    Weight: 268 lb 3.2 oz (121.7 kg)    Body mass index is 46.04 kg/m.           Physical Examination:   General appearance: alert, well appearing, and in no distress  Mental status: alert, oriented to person, place, and time  Skin: warm & dry   Extremities: Edema: Trace    Cardiovascular: normal heart rate noted  Respiratory:  normal respiratory effort, no distress  Abdomen: gravid, soft, non-tender  Pelvic: Cervical exam deferred         Fetal Status: Fetal Heart Rate (bpm): 150 u/s   Movement: Present    Fetal Surveillance Testing today: [place]216+9wks,cephalic,anterior low lying placenta,tip of placenta to cx 1.5-1.7 cm,cx 2.9 cm,svp of fluid 5.8 cm,normal ovaries,fhr 150 bpm,RI .70,.78,.77=85%,EFW 606 g 7.1%,FL 1.1%   Results for orders placed or performed in visit on 10/08/20 (from the past 24 hour(s))  POC Urinalysis Dipstick OB   Collection Time: 10/08/20  3:06 PM  Result Value Ref Range   Color, UA     Clarity, UA     Glucose, UA Negative Negative   Bilirubin, UA     Ketones, UA neg    Spec Grav, UA     Blood, UA neg    pH, UA     POC,PROTEIN,UA Negative Negative, Trace, Small (1+), Moderate (2+), Large (3+), 4+   Urobilinogen, UA     Nitrite, UA neg    Leukocytes, UA Negative Negative   Appearance     Odor      Assessment & Plan:  High-risk pregnancy: G1P0 at 224w5dith an Estimated Date of Delivery: 01/23/21   1) FGR, mostly due to FL at 1% (other parameters 19-60%) with nl dopplers and fluid  2) Low-lying placenta, 1.5-1.7cm from cx os  3) +AFP for OSB, low risk NIPS  4) Elevated BP without dx, get labs today and have her return  for RN BP check tomorrow; start bASA 155m per LHE  Meds:  Meds ordered this encounter  Medications  . aspirin 81 MG chewable tablet    Sig: Chew 2 tablets (162 mg total) by mouth daily.    Dispense:  60 tablet    Refill:  4    Order Specific Question:   Supervising Provider    Answer:   EFlorian Buff[[id number]]    Labs/procedures today: growth & dopplers  Treatment Plan:  Start weekly BPP & dopplers due to FGR; will have HROB with Dr EElonda Huskyin 2wks with PN2  Reviewed: Preterm labor symptoms and general obstetric precautions including but not limited to vaginal bleeding, contractions, leaking of fluid and fetal movement were reviewed in detail with the  patient.  All questions were answered. Has home bp cuff. Check bp weekly, let uKoreaknow if >140/90.   Follow-up: Return in about 2 weeks (around 10/22/2020) for RN BP tomorrow; 2wks PN2, in person, HROB; weekly BPP/dopplers- sched out.   Future Appointments  Date Time Provider DH. [place] 10/09/2020  9:50 AM [facility] NURSE [facility]  10/17/2020 10:15 AM [facility] [place] None  10/24/2020  8:30 AM [facility] LAB [facility]  10/24/2020  9:00 AM [facility] [place] None  10/24/2020  9:50 AM EFlorian Buff MD [facility]  10/31/2020  8:30 AM [facility] [place] None  10/31/2020  9:30 AM EChancy Milroy MD [facility]  11/07/2020  8:30 AM [facility] [place] None  11/07/2020  9:40 AM [place], LMertie Clause MD [facility]    Orders Placed This Encounter  Procedures  . Comp Met (CMET)  . Protein / creatinine ratio, urine  . POC Urinalysis Dipstick OB   KMyrtis SerCChildren[facility]12/29/2021 5:21 PM

## 2020-10-08 NOTE — Progress Notes (Signed)
[place] 24+6 wks,cephalic,anterior low lying placenta,tip of placenta to cx 1.5-1.7 cm,cx 2.9 cm,svp of fluid 5.8 cm,normal ovaries,fhr 150 bpm,RI .70,.78,.77=85%,EFW 606 g 7.1%,FL 1.1%

## 2020-10-08 NOTE — Patient Instructions (Signed)
Rebecca Frey, I greatly value your feedback.  If you receive a survey following your visit with [place] today, we appreciate you taking the time to fill it out.  Thanks, Philipp Deputy, CNM   You will have your sugar test next visit.  Please do not eat or drink anything after midnight the night before you come, not even water.  You will be here for at least two hours.  Please make an appointment online for the bloodwork at [email or web address] for 8:30am (or as close to this as possible). Make sure you select the [facility] service center. The day of the appointment, check in with our office first, then you will go to [facility] to start the sugar test.    [facility] HAS MOVED!!! It is now [facility] ([address] [place], [place] [address]) Entrance C, located off of [address] valet parking  Go to [email or web address] to register for FREE online childbirth classes   Call the office [phone]) or go to [facility] if:  You begin to have strong, frequent contractions  Your water breaks.  Sometimes it is a big gush of fluid, sometimes it is just a trickle that keeps getting your panties wet or running down your legs  You have vaginal bleeding.  It is normal to have a small amount of spotting if your cervix was checked.   You don't feel your baby moving like normal.  If you don't, get you something to eat and drink and lay down and focus on feeling your baby move.   If your baby is still not moving like normal, you should call the office or go to [facility].  [place] Pediatricians/Family Doctors:  [facility] [phone]            [facility] [phone]                 [facility] [phone] (usually not accepting new patients unless you have family there already, you are always welcome to call and ask)       [facility] Department [phone]       [facility] Pediatricians/Family Doctors:    [facility]: [phone]  [facility]: [phone]  [facility]: [phone]  [facility] Doctors:   [facility]: [phone]   [facility]: [phone]  [facility] Doctors:  [facility]: [phone]   Home Blood Pressure Monitoring for Patients   Your provider has recommended that you check your blood pressure (BP) at least once a week at home. If you do not have a blood pressure cuff at home, one will be provided for you. Contact your provider if you have not received your monitor within 1 week.   Helpful Tips for Accurate Home Blood Pressure Checks  . Don't smoke, exercise, or drink caffeine 30 minutes before checking your BP . Use the restroom before checking your BP (a full bladder can raise your pressure) . Relax in a comfortable upright chair . Feet on the ground . Left arm resting comfortably on a flat surface at the level of your heart . Legs uncrossed . Back supported . Sit quietly and don't talk . Place the cuff on your bare arm . Adjust snuggly, so that only two fingertips can fit between your skin and the top of the cuff . Check 2 readings separated by at least one minute . Keep a log of your BP readings .  For a visual, please reference this diagram: http://ccnc.care/bpdiagram  Provider Name: [facility]     Phone: [phone]  Zone 1: ALL CLEAR  Continue to monitor your symptoms:  . BP reading is less than 140 (top number) or less than 90 (bottom number)  . No right upper stomach pain . No headaches or seeing spots . No feeling nauseated or throwing up . No swelling in face and hands  Zone 2: CAUTION Call your doctor's office for any of the following:  . BP reading is greater than 140 (top number) or greater than 90 (bottom number)  . Stomach pain under your ribs in the middle or right side . Headaches or seeing spots . Feeling  nauseated or throwing up . Swelling in face and hands  Zone 3: EMERGENCY  Seek immediate medical care if you have any of the following:  . BP reading is greater than160 (top number) or greater than 110 (bottom number) . Severe headaches not improving with Tylenol . Serious difficulty catching your breath . Any worsening symptoms from Zone 2   Second Trimester of Pregnancy The second trimester is from week 13 through week 28, months 4 through 6. The second trimester is often a time when you feel your best. Your body has also adjusted to being pregnant, and you begin to feel better physically. Usually, morning sickness has lessened or quit completely, you may have more energy, and you may have an increase in appetite. The second trimester is also a time when the fetus is growing rapidly. At the end of the sixth month, the fetus is about 9 inches long and weighs about 1 pounds. You will likely begin to feel the baby move (quickening) between 18 and 20 weeks of the pregnancy. BODY CHANGES Your body goes through many changes during pregnancy. The changes vary from woman to woman.   Your weight will continue to increase. You will notice your lower abdomen bulging out.  You may begin to get stretch marks on your hips, abdomen, and breasts.  You may develop headaches that can be relieved by medicines approved by your health care provider.  You may urinate more often because the fetus is pressing on your bladder.  You may develop or continue to have heartburn as a result of your pregnancy.  You may develop constipation because certain hormones are causing the muscles that push waste through your intestines to slow down.  You may develop hemorrhoids or swollen, bulging veins (varicose veins).  You may have back pain because of the weight gain and pregnancy hormones relaxing your joints between the bones in your pelvis and as a result of a shift in weight and the muscles that support your  balance.  Your breasts will continue to grow and be tender.  Your gums may bleed and may be sensitive to brushing and flossing.  Dark spots or blotches (chloasma, mask of pregnancy) may develop on your face. This will likely fade after the baby is born.  A dark line from your belly button to the pubic area (linea nigra) may appear. This will likely fade after the baby is born.  You may have changes in your hair. These can include thickening of your hair, rapid growth, and changes in texture. Some women also have hair loss during or after pregnancy, or hair that feels dry or thin. Your hair will most likely return to normal after your baby is born. WHAT TO EXPECT AT YOUR PRENATAL VISITS During a routine  prenatal visit:  You will be weighed to make sure you and the fetus are growing normally.  Your blood pressure will be taken.  Your abdomen will be measured to track your baby's growth.  The fetal heartbeat will be listened to.  Any test results from the previous visit will be discussed. Your health care provider may ask you:  How you are feeling.  If you are feeling the baby move.  If you have had any abnormal symptoms, such as leaking fluid, bleeding, severe headaches, or abdominal cramping.  If you have any questions. Other tests that may be performed during your second trimester include:  Blood tests that check for:  Low iron levels (anemia).  Gestational diabetes (between 24 and 28 weeks).  Rh antibodies.  Urine tests to check for infections, diabetes, or protein in the urine.  An ultrasound to confirm the proper growth and development of the baby.  An amniocentesis to check for possible genetic problems.  Fetal screens for spina bifida and Down syndrome. HOME CARE INSTRUCTIONS   Avoid all smoking, herbs, alcohol, and unprescribed drugs. These chemicals affect the formation and growth of the baby.  Follow your health care provider's instructions regarding  medicine use. There are medicines that are either safe or unsafe to take during pregnancy.  Exercise only as directed by your health care provider. Experiencing uterine cramps is a good sign to stop exercising.  Continue to eat regular, healthy meals.  Wear a good support bra for breast tenderness.  Do not use hot tubs, steam rooms, or saunas.  Wear your seat belt at all times when driving.  Avoid raw meat, uncooked cheese, cat litter boxes, and soil used by cats. These carry germs that can cause birth defects in the baby.  Take your prenatal vitamins.  Try taking a stool softener (if your health care provider approves) if you develop constipation. Eat more high-fiber foods, such as fresh vegetables or fruit and whole grains. Drink plenty of fluids to keep your urine clear or pale yellow.  Take warm sitz baths to soothe any pain or discomfort caused by hemorrhoids. Use hemorrhoid cream if your health care provider approves.  If you develop varicose veins, wear support hose. Elevate your feet for 15 minutes, 3-4 times a day. Limit salt in your diet.  Avoid heavy lifting, wear low heel shoes, and practice good posture.  Rest with your legs elevated if you have leg cramps or low back pain.  Visit your dentist if you have not gone yet during your pregnancy. Use a soft toothbrush to brush your teeth and be gentle when you floss.  A sexual relationship may be continued unless your health care provider directs you otherwise.  Continue to go to all your prenatal visits as directed by your health care provider. SEEK MEDICAL CARE IF:   You have dizziness.  You have mild pelvic cramps, pelvic pressure, or nagging pain in the abdominal area.  You have persistent nausea, vomiting, or diarrhea.  You have a bad smelling vaginal discharge.  You have pain with urination. SEEK IMMEDIATE MEDICAL CARE IF:   You have a fever.  You are leaking fluid from your vagina.  You have spotting or  bleeding from your vagina.  You have severe abdominal cramping or pain.  You have rapid weight gain or loss.  You have shortness of breath with chest pain.  You notice sudden or extreme swelling of your face, hands, ankles, feet, or legs.  You have not  felt your baby move in over an hour.  You have severe headaches that do not go away with medicine.  You have vision changes. Document Released: 09/21/2001 Document Revised: 10/02/2013 Document Reviewed: 11/28/2012 [facility] Patient Information 2015 [place], [place]. This information is not intended to replace advice given to you by your health care provider. Make sure you discuss any questions you have with your health care provider.

## 2020-10-09 ENCOUNTER — Ambulatory Visit (INDEPENDENT_AMBULATORY_CARE_PROVIDER_SITE_OTHER): Payer: Medicaid Other | Admitting: *Deleted

## 2020-10-09 VITALS — BP 137/83 | HR 77

## 2020-10-09 DIAGNOSIS — O162 Unspecified maternal hypertension, second trimester: Secondary | ICD-10-CM

## 2020-10-09 LAB — COMPREHENSIVE METABOLIC PANEL
ALT: 14 IU/L (ref 0–32)
AST: 16 IU/L (ref 0–40)
Albumin/Globulin Ratio: 1 — ABNORMAL LOW (ref 1.2–2.2)
Albumin: 3.5 g/dL — ABNORMAL LOW (ref 3.9–5.0)
Alkaline Phosphatase: 106 IU/L (ref 44–121)
BUN/Creatinine Ratio: 11 (ref 9–23)
BUN: 8 mg/dL (ref 6–20)
Bilirubin Total: <0.2 mg/dL (ref 0.0–1.2)
CO2: 22 mmol/L (ref 20–29)
Calcium: 9.6 mg/dL (ref 8.7–10.2)
Chloride: 103 mmol/L (ref 96–106)
Creatinine, Ser: 0.74 mg/dL (ref 0.57–1.00)
GFR calc Af Amer: 127 mL/min/{1.73_m2} (ref >59)
GFR calc non Af Amer: 111 mL/min/{1.73_m2} (ref >59)
Globulin, Total: 3.6 g/dL (ref 1.5–4.5)
Glucose: 69 mg/dL (ref 65–99)
Potassium: 4.8 mmol/L (ref 3.5–5.2)
Sodium: 137 mmol/L (ref 134–144)
Total Protein: 7.1 g/dL (ref 6.0–8.5)

## 2020-10-09 LAB — PROTEIN / CREATININE RATIO, URINE

## 2020-10-09 NOTE — Progress Notes (Addendum)
° °  NURSE VISIT- BLOOD PRESSURE CHECK  SUBJECTIVE:  Rebecca Frey is a 29 y.o. G1P0 female here for BP check. She is [redacted]w[redacted]d pregnant    HYPERTENSION ROS:  Pregnant/postpartum:   Severe headaches that don't go away with tylenol/other medicines: No   Visual changes (seeing spots/double/blurred vision) No   Severe pain under right breast breast or in center of upper chest No   Severe nausea/vomiting No   Taking medicines as instructed not applicable  OBJECTIVE:  BP 137/83    Pulse 77    LMP 03/19/2020   Appearance alert, well appearing, and in no distress.  ASSESSMENT: Pregnancy [redacted]w[redacted]d  blood pressure check  PLAN: Discussed with Cathie Beams, CNM   Recommendations: no changes needed   Follow-up: as scheduled   [name]  10/09/2020 10:24 AM    BPs early pg 130's/80's--home BPs have been that as well.  Probably a CHTN situation rather than GHTN. Will send chart to [facility] for review/recommendations on dx.  Is already on Weekly BPPs/HROB visits d/t FGR.

## 2020-10-10 LAB — PROTEIN / CREATININE RATIO, URINE
Creatinine, Urine: 242.3 mg/dL
Protein, Ur: 34.6 mg/dL
Protein/Creat Ratio: 143 mg/g creat (ref 0–200)

## 2020-10-16 ENCOUNTER — Encounter: Payer: Self-pay | Admitting: Advanced Practice Midwife

## 2020-10-17 ENCOUNTER — Other Ambulatory Visit: Payer: Medicaid Other

## 2020-10-23 ENCOUNTER — Encounter: Payer: Self-pay | Admitting: *Deleted

## 2020-10-24 ENCOUNTER — Other Ambulatory Visit: Payer: Self-pay

## 2020-10-24 ENCOUNTER — Other Ambulatory Visit: Payer: Medicaid Other

## 2020-10-24 ENCOUNTER — Encounter: Payer: Self-pay | Admitting: Obstetrics & Gynecology

## 2020-10-24 ENCOUNTER — Ambulatory Visit (INDEPENDENT_AMBULATORY_CARE_PROVIDER_SITE_OTHER): Payer: Medicaid Other | Admitting: Obstetrics & Gynecology

## 2020-10-24 VITALS — BP 137/91 | HR 85 | Wt 269.0 lb

## 2020-10-24 DIAGNOSIS — Z3A26 26 weeks gestation of pregnancy: Secondary | ICD-10-CM | POA: Diagnosis not present

## 2020-10-24 DIAGNOSIS — O10919 Unspecified pre-existing hypertension complicating pregnancy, unspecified trimester: Secondary | ICD-10-CM | POA: Diagnosis not present

## 2020-10-24 DIAGNOSIS — O0992 Supervision of high risk pregnancy, unspecified, second trimester: Secondary | ICD-10-CM

## 2020-10-24 DIAGNOSIS — Z1389 Encounter for screening for other disorder: Secondary | ICD-10-CM

## 2020-10-24 DIAGNOSIS — O365990 Maternal care for other known or suspected poor fetal growth, unspecified trimester, not applicable or unspecified: Secondary | ICD-10-CM

## 2020-10-24 DIAGNOSIS — O0993 Supervision of high risk pregnancy, unspecified, third trimester: Secondary | ICD-10-CM

## 2020-10-24 DIAGNOSIS — Z3A27 27 weeks gestation of pregnancy: Secondary | ICD-10-CM

## 2020-10-24 LAB — POCT URINALYSIS DIPSTICK OB
Blood, UA: NEGATIVE
Glucose, UA: NEGATIVE
Ketones, UA: NEGATIVE
Leukocytes, UA: NEGATIVE
Nitrite, UA: NEGATIVE
POC,PROTEIN,UA: NEGATIVE

## 2020-10-24 NOTE — Progress Notes (Signed)
HIGH-RISK PREGNANCY VISIT Patient name: Rebecca Frey MRN [id number]  Date of birth: June 22, 1991 Chief Complaint:   Routine Prenatal Visit  History of Present Illness:   Rebecca Frey is a 30 y.o. G1P0 female at [redacted]w[redacted]d with an Estimated Date of Delivery: 01/23/21 being seen today for ongoing management of a high-risk pregnancy complicated by chronic hypertension currently on no meds, ASA 162 and FGR.  Today she reports no complaints.  Depression screen [facility] 2/9 10/24/2020 07/17/2020 06/19/2020  Decreased Interest 1 2 1   Down, Depressed, Hopeless 1 1 0  PHQ - 2 Score 2 3 1   Altered sleeping 2 2 3   Tired, decreased energy 3 2 3   Change in appetite 2 3 2   Feeling bad or failure about yourself  2 0 0  Trouble concentrating 2 3 3   Moving slowly or fidgety/restless 0 0 0  Suicidal thoughts 0 0 0  PHQ-9 Score 13 13 12   Difficult doing work/chores - - Not difficult at all    Contractions: Not present. Vag. Bleeding: None.  Movement: Present. denies leaking of fluid.  Review of Systems:   Pertinent items are noted in HPI Denies abnormal vaginal discharge w/ itching/odor/irritation, headaches, visual changes, shortness of breath, chest pain, abdominal pain, severe nausea/vomiting, or problems with urination or bowel movements unless otherwise stated above. Pertinent History Reviewed:  Reviewed past medical,surgical, social, obstetrical and family history.  Reviewed problem list, medications and allergies. Physical Assessment:   Vitals:   10/24/20 0931  BP: (!) 137/91  Pulse: 85  Weight: 269 lb (122 kg)  Body mass index is 46.17 kg/m.           Physical Examination:   General appearance: alert, well appearing, and in no distress  Mental status: alert, oriented to person, place, and time  Skin: warm & dry   Extremities: Edema: Trace    Cardiovascular: normal heart rate noted  Respiratory: normal respiratory effort, no distress  Abdomen: gravid, soft, non-tender  Pelvic: Cervical exam  deferred         Fetal Status: Fetal Heart Rate (bpm): 150 Fundal Height: 28 cm Movement: Present    Fetal Surveillance Testing today: FHR 150   Chaperone: N/A    Results for orders placed or performed in visit on 10/24/20 (from the past 24 hour(s))  POC Urinalysis Dipstick OB   Collection Time: 10/24/20  9:30 AM  Result Value Ref Range   Color, UA     Clarity, UA     Glucose, UA Negative Negative   Bilirubin, UA     Ketones, UA neg    Spec Grav, UA     Blood, UA neg    pH, UA     POC,PROTEIN,UA Negative Negative, Trace, Small (1+), Moderate (2+), Large (3+), 4+   Urobilinogen, UA     Nitrite, UA neg    Leukocytes, UA Negative Negative   Appearance     Odor      Assessment & Plan:  High-risk pregnancy: G1P0 at [redacted]w[redacted]d with an Estimated Date of Delivery: 01/23/21   1) CHTN, no meds, but likely will be needed in the coming weeks, continue ASA 162 mg daily,   2) FGR, begin weekly BPP next week til 32 weeks with Dopplers, then twice weekly at 32 weeks  Meds: No orders of the defined types were placed in this encounter.   Labs/procedures today: PN2  Treatment Plan:  As above  Reviewed: Preterm labor symptoms and general obstetric precautions including but not limited  to vaginal bleeding, contractions, leaking of fluid and fetal movement were reviewed in detail with the patient.  All questions were answered. Has home bp cuff. Rx faxed to . Check bp weekly, let us know if >140/90.   Follow-up: Return for keep scheduled appts.   Future Appointments  Date Time Provider Department Center  10/31/2020  8:30 AM [facility] [place] [facility] None  10/31/2020  9:30 AM Hermina Staggers, MD [facility]  11/07/2020  8:30 AM [facility] [place] [facility] None  11/07/2020  9:40 AM Lazaro Arms, MD [facility]  11/14/2020  8:30 AM [facility] [place] [facility] None  11/14/2020  9:30 AM Cheral Marker, CNM [facility]  11/21/2020  8:30 AM [facility] [place] [facility] None  11/21/2020  9:30 AM  Marny Lowenstein, PA-C [facility]  12/05/2020  8:30 AM [facility] [place] [facility] None  12/05/2020  9:30 AM Hermina Staggers, MD [facility]  12/12/2020  8:30 AM [facility] [place] [facility] None  12/12/2020  9:30 AM Lazaro Arms, MD [facility]  12/19/2020  8:30 AM [facility] [place] [facility] None  12/19/2020  9:30 AM Lazaro Arms, MD [facility]  12/26/2020  8:30 AM [facility] [place] [facility] None  12/26/2020  9:30 AM Cheral Marker, CNM [facility]  01/02/2021  8:30 AM [facility] [place] [facility] None  01/02/2021  9:30 AM Lazaro Arms, MD [facility]  01/09/2021  8:30 AM [facility] [place] [facility] None  01/09/2021  9:30 AM Cheral Marker, CNM [facility]  01/16/2021  8:30 AM [facility] [place] [facility] None  01/16/2021  9:30 AM Lazaro Arms, MD [facility]  01/23/2021  9:00 AM [facility] [place] [facility] None  01/23/2021  9:50 AM Cheral Marker, CNM [facility]  01/30/2021  9:00 AM [facility] [place] [facility] None  01/30/2021  9:50 AM Jakaylah Schlafer, Amaryllis Dyke, MD [facility]    Orders Placed This Encounter  Procedures  . POC Urinalysis Dipstick OB   Amaryllis Dyke Vaibhav Fogleman  10/24/2020 10:12 AM

## 2020-10-25 LAB — CBC
Hematocrit: 37.1 % (ref 34.0–46.6)
Hemoglobin: 12.5 g/dL (ref 11.1–15.9)
MCH: 29 pg (ref 26.6–33.0)
MCHC: 33.7 g/dL (ref 31.5–35.7)
MCV: 86 fL (ref 79–97)
Platelets: 301 10*3/uL (ref 150–450)
RBC: 4.31 x10E6/uL (ref 3.77–5.28)
RDW: 14.4 % (ref 11.7–15.4)
WBC: 13.8 10*3/uL — ABNORMAL HIGH (ref 3.4–10.8)

## 2020-10-25 LAB — RPR: RPR Ser Ql: NONREACTIVE

## 2020-10-25 LAB — GLUCOSE TOLERANCE, 2 HOURS W/ 1HR
Glucose, 1 hour: 198 mg/dL — ABNORMAL HIGH (ref 65–179)
Glucose, 2 hour: 160 mg/dL — ABNORMAL HIGH (ref 65–152)
Glucose, Fasting: 77 mg/dL (ref 65–91)

## 2020-10-25 LAB — HIV ANTIBODY (ROUTINE TESTING W REFLEX): HIV Screen 4th Generation wRfx: NONREACTIVE

## 2020-10-25 LAB — ANTIBODY SCREEN: Antibody Screen: NEGATIVE

## 2020-10-27 ENCOUNTER — Other Ambulatory Visit: Payer: Self-pay | Admitting: *Deleted

## 2020-10-27 DIAGNOSIS — O24419 Gestational diabetes mellitus in pregnancy, unspecified control: Secondary | ICD-10-CM | POA: Insufficient documentation

## 2020-10-27 DIAGNOSIS — O0992 Supervision of high risk pregnancy, unspecified, second trimester: Secondary | ICD-10-CM

## 2020-10-27 DIAGNOSIS — Z3A27 27 weeks gestation of pregnancy: Secondary | ICD-10-CM

## 2020-10-27 DIAGNOSIS — O24410 Gestational diabetes mellitus in pregnancy, diet controlled: Secondary | ICD-10-CM

## 2020-10-27 MED ORDER — ACCU-CHEK GUIDE ME W/DEVICE KIT: 1.0000 | PACK | Freq: Four times a day (QID) | 0 refills | Status: DC

## 2020-10-27 MED ORDER — ACCU-CHEK SOFTCLIX LANCETS MISC: 12 refills | Status: DC

## 2020-10-27 MED ORDER — GLUCOSE BLOOD VI STRP: ORAL_STRIP | 12 refills | Status: DC

## 2020-10-29 ENCOUNTER — Other Ambulatory Visit: Payer: Self-pay

## 2020-10-29 ENCOUNTER — Encounter: Payer: Medicaid Other | Attending: Obstetrics & Gynecology | Admitting: Skilled Nursing Facility1

## 2020-10-29 ENCOUNTER — Encounter: Payer: Self-pay | Admitting: Skilled Nursing Facility1

## 2020-10-29 DIAGNOSIS — O24410 Gestational diabetes mellitus in pregnancy, diet controlled: Secondary | ICD-10-CM | POA: Diagnosis not present

## 2020-10-29 DIAGNOSIS — Z3A27 27 weeks gestation of pregnancy: Secondary | ICD-10-CM | POA: Diagnosis not present

## 2020-10-29 NOTE — Progress Notes (Signed)
Pt states she has high blood pressure and does take an aspirin daily for it. Pt states she checks her blood pressure daily. Pt states she is about [redacted] weeks along. Pt states this is her first pregnancy.   24 hr recall: Egg sandwich Peanut butter crackers Sandwich  Lasagna  Beverages: sweet tea, juice  Patient was seen on 10/29/2020 for Gestational Diabetes self-management via MyChart: pt verabilzed agreement to this visit type and identified by name and DOB. The following learning objectives were met by the patient during this course:   States the definition of Gestational Diabetes  States why dietary management is important in controlling blood glucose  Describes the effects each nutrient has on blood glucose levels  Demonstrates ability to create a balanced meal plan  Demonstrates carbohydrate counting   States when to check blood glucose levels  Demonstrates proper blood glucose monitoring techniques  States the effect of stress and exercise on blood glucose levels  States the importance of limiting caffeine and abstaining from alcohol and smoking  Education Topics: Self monitoring hygiene, numbers to aim for, how often to check Hypoglycemia Physical activity and its effect on blood sugar and blood pressure  Proper Hydration Neccessaty of limiting simple carbohydrates and calorically dense foods Stress reduction worth and techniques   Blood glucose monitoring:  Checks her blood sugar 2-3 times a day 1 hour after eating: 136, 164 and fasting: 77, 82  Patient instructed to monitor glucose levels: FBS: 60 - <90 1 hour: <140 2 hour: <120  *Patient received handouts:  Nutrition Diabetes and Pregnancy  Carbohydrate Counting List  Patient will be seen for follow-up as needed.

## 2020-10-30 ENCOUNTER — Other Ambulatory Visit: Payer: Self-pay | Admitting: Physician Assistant

## 2020-10-30 DIAGNOSIS — IMO0002 Reserved for concepts with insufficient information to code with codable children: Secondary | ICD-10-CM

## 2020-10-30 DIAGNOSIS — O10919 Unspecified pre-existing hypertension complicating pregnancy, unspecified trimester: Secondary | ICD-10-CM

## 2020-10-31 ENCOUNTER — Ambulatory Visit (INDEPENDENT_AMBULATORY_CARE_PROVIDER_SITE_OTHER): Payer: Medicaid Other | Admitting: Obstetrics and Gynecology

## 2020-10-31 ENCOUNTER — Other Ambulatory Visit: Payer: Self-pay

## 2020-10-31 ENCOUNTER — Encounter: Payer: Self-pay | Admitting: Obstetrics and Gynecology

## 2020-10-31 ENCOUNTER — Ambulatory Visit (INDEPENDENT_AMBULATORY_CARE_PROVIDER_SITE_OTHER): Payer: Medicaid Other

## 2020-10-31 VITALS — BP 130/81 | HR 70 | Wt 269.6 lb

## 2020-10-31 DIAGNOSIS — Z23 Encounter for immunization: Secondary | ICD-10-CM | POA: Diagnosis not present

## 2020-10-31 DIAGNOSIS — O10919 Unspecified pre-existing hypertension complicating pregnancy, unspecified trimester: Secondary | ICD-10-CM

## 2020-10-31 DIAGNOSIS — O365990 Maternal care for other known or suspected poor fetal growth, unspecified trimester, not applicable or unspecified: Secondary | ICD-10-CM

## 2020-10-31 DIAGNOSIS — Z3A28 28 weeks gestation of pregnancy: Secondary | ICD-10-CM | POA: Diagnosis not present

## 2020-10-31 DIAGNOSIS — O24410 Gestational diabetes mellitus in pregnancy, diet controlled: Secondary | ICD-10-CM

## 2020-10-31 DIAGNOSIS — Z1389 Encounter for screening for other disorder: Secondary | ICD-10-CM

## 2020-10-31 DIAGNOSIS — Z6791 Unspecified blood type, Rh negative: Secondary | ICD-10-CM

## 2020-10-31 DIAGNOSIS — R772 Abnormality of alphafetoprotein: Secondary | ICD-10-CM

## 2020-10-31 DIAGNOSIS — O26899 Other specified pregnancy related conditions, unspecified trimester: Secondary | ICD-10-CM

## 2020-10-31 DIAGNOSIS — O4440 Low lying placenta NOS or without hemorrhage, unspecified trimester: Secondary | ICD-10-CM

## 2020-10-31 DIAGNOSIS — IMO0002 Reserved for concepts with insufficient information to code with codable children: Secondary | ICD-10-CM

## 2020-10-31 DIAGNOSIS — O0993 Supervision of high risk pregnancy, unspecified, third trimester: Secondary | ICD-10-CM

## 2020-10-31 LAB — POCT URINALYSIS DIPSTICK OB
Blood, UA: NEGATIVE
Glucose, UA: NEGATIVE
Ketones, UA: NEGATIVE
Leukocytes, UA: NEGATIVE
Nitrite, UA: NEGATIVE
POC,PROTEIN,UA: NEGATIVE

## 2020-10-31 NOTE — Progress Notes (Signed)
[place] 28 wks,cephalic,normal ovaries,anterior placenta gr 1,tip of placenta to cervix 2.3 cm,cx length 2.7 cm,FHR 144 BPM,AFI 13 cm,EFW 1066 g 18.5%,FL 3.3%

## 2020-10-31 NOTE — Progress Notes (Signed)
Subjective:  Rebecca Frey is a 30 y.o. G1P0 at [redacted]w[redacted]d being seen today for ongoing prenatal care.  She is currently monitored for the following issues for this high-risk pregnancy and has Supervision of high-risk pregnancy; Rh negative state in antepartum period; Low-lying placenta; Elevated AFP; Fetal growth retardation, antenatal; Chronic hypertension during pregnancy, antepartum; and Gestational diabetes on their problem list.  Patient reports general discomforts of pregnancy.  Contractions: Not present. Vag. Bleeding: None.  Movement: Present. Denies leaking of fluid.   The following portions of the patient's history were reviewed and updated as appropriate: allergies, current medications, past family history, past medical history, past social history, past surgical history and problem list. Problem list updated.  Objective:   Vitals:   10/31/20 0940  BP: 130/81  Pulse: 70  Weight: 269 lb 9.6 oz (122.3 kg)    Fetal Status:     Movement: Present     General:  Alert, oriented and cooperative. Patient is in no acute distress.  Skin: Skin is warm and dry. No rash noted.   Cardiovascular: Normal heart rate noted  Respiratory: Normal respiratory effort, no problems with respiration noted  Abdomen: Soft, gravid, appropriate for gestational age. Pain/Pressure: Absent     Pelvic:  Cervical exam deferred        Extremities: Normal range of motion.  Edema: Trace  Mental Status: Normal mood and affect. Normal behavior. Normal judgment and thought content.   Urinalysis:      Assessment and Plan:  Pregnancy: G1P0 at [redacted]w[redacted]d  1. Supervision of high risk pregnancy in third trimester Stable - POC Urinalysis Dipstick OB  2. Chronic hypertension during pregnancy, antepartum BP stable without meds Continue with qd BASA Growth scan today, continue with serial growth scans  3. Diet controlled gestational diabetes mellitus (GDM) in second trimester CBG's in goal range except for diet  choices Diet reviewed with pt Continue with diet Growth scan today, continue with serial growth scans  4. Fetal growth retardation, antenatal See above  5. Low-lying placenta Resolved on U/S today  6. Rh negative state in antepartum period Rhogam today  7. Screening for genitourinary condition  - POC Urinalysis Dipstick OB  8. Elevated AFP Serial growth scans  Preterm labor symptoms and general obstetric precautions including but not limited to vaginal bleeding, contractions, leaking of fluid and fetal movement were reviewed in detail with the patient. Please refer to After Visit Summary for other counseling recommendations.  Return in about 2 weeks (around 11/14/2020) for OB visit, face to face, any provider.   Hermina Staggers, MD

## 2020-10-31 NOTE — Patient Instructions (Signed)

## 2020-11-07 ENCOUNTER — Other Ambulatory Visit: Payer: Medicaid Other

## 2020-11-07 ENCOUNTER — Other Ambulatory Visit: Payer: Self-pay

## 2020-11-07 ENCOUNTER — Encounter: Payer: Self-pay | Admitting: Obstetrics & Gynecology

## 2020-11-07 ENCOUNTER — Ambulatory Visit (INDEPENDENT_AMBULATORY_CARE_PROVIDER_SITE_OTHER): Payer: Medicaid Other | Admitting: Obstetrics & Gynecology

## 2020-11-07 VITALS — BP 131/92 | HR 71 | Wt 269.0 lb

## 2020-11-07 DIAGNOSIS — O0993 Supervision of high risk pregnancy, unspecified, third trimester: Secondary | ICD-10-CM

## 2020-11-07 DIAGNOSIS — Z1389 Encounter for screening for other disorder: Secondary | ICD-10-CM

## 2020-11-07 DIAGNOSIS — Z3A29 29 weeks gestation of pregnancy: Secondary | ICD-10-CM

## 2020-11-07 LAB — POCT URINALYSIS DIPSTICK OB
Blood, UA: NEGATIVE
Glucose, UA: NEGATIVE
Ketones, UA: NEGATIVE
Leukocytes, UA: NEGATIVE
Nitrite, UA: NEGATIVE

## 2020-11-07 NOTE — Progress Notes (Signed)
HIGH-RISK PREGNANCY VISIT Patient name: Rebecca Frey MRN [id number]  Date of birth: 1990/12/14 Chief Complaint:   Routine Prenatal Visit and High Risk Gestation  History of Present Illness:   Rebecca Frey is a 30 y.o. G1P0 female at [redacted]w[redacted]d with an Estimated Date of Delivery: 01/23/21 being seen today for ongoing management of a high-risk pregnancy complicated by chronic hypertension, no meds, gestational diabetes.  Today she reports no complaints.  Depression screen [facility] 2/9 10/29/2020 10/24/2020 07/17/2020 06/19/2020  Decreased Interest 0 1 2 1   Down, Depressed, Hopeless 0 1 1 0  PHQ - 2 Score 0 2 3 1   Altered sleeping - 2 2 3   Tired, decreased energy - 3 2 3   Change in appetite - 2 3 2   Feeling bad or failure about yourself  - 2 0 0  Trouble concentrating - 2 3 3   Moving slowly or fidgety/restless - 0 0 0  Suicidal thoughts - 0 0 0  PHQ-9 Score - 13 13 12   Difficult doing work/chores - - - Not difficult at all    Contractions: Irritability. Vag. Bleeding: None.  Movement: Present. denies leaking of fluid.  Review of Systems:   Pertinent items are noted in HPI Denies abnormal vaginal discharge w/ itching/odor/irritation, headaches, visual changes, shortness of breath, chest pain, abdominal pain, severe nausea/vomiting, or problems with urination or bowel movements unless otherwise stated above. Pertinent History Reviewed:  Reviewed past medical,surgical, social, obstetrical and family history.  Reviewed problem list, medications and allergies. Physical Assessment:   Vitals:   11/07/20 1006  BP: (!) 131/92  Pulse: 71  Weight: 269 lb (122 kg)  Body mass index is 46.17 kg/m.           Physical Examination:   General appearance: alert, well appearing, and in no distress  Mental status: alert, oriented to person, place, and time  Skin: warm & dry   Extremities: Edema: Trace    Cardiovascular: normal heart rate noted  Respiratory: normal respiratory effort, no  distress  Abdomen: gravid, soft, non-tender  Pelvic: Cervical exam deferred         Fetal Status: Fetal Heart Rate (bpm): 151 Fundal Height: 29 cm Movement: Present    Fetal Surveillance Testing today: FHR 151   Chaperone: N/A    Results for orders placed or performed in visit on 11/07/20 (from the past 24 hour(s))  POC Urinalysis Dipstick OB   Collection Time: 11/07/20 10:06 AM  Result Value Ref Range   Color, UA     Clarity, UA     Glucose, UA Negative Negative   Bilirubin, UA     Ketones, UA neg    Spec Grav, UA     Blood, UA neg    pH, UA     POC,PROTEIN,UA Trace Negative, Trace, Small (1+), Moderate (2+), Large (3+), 4+   Urobilinogen, UA     Nitrite, UA neg    Leukocytes, UA Negative Negative   Appearance     Odor      Assessment & Plan:  High-risk pregnancy: G1P0 at [redacted]w[redacted]d with an Estimated Date of Delivery: 01/23/21   1) CHTN, no meds yet needed,   2) A1DM, still patterning,   Meds: No orders of the defined types were placed in this encounter.   Labs/procedures today:   Treatment Plan:  Close follow up 2 weeks  Reviewed:  labor symptoms and general obstetric precautions including but not limited to vaginal bleeding, contractions, leaking of fluid and fetal movement were  reviewed in detail with the patient.  All questions were answered.  home bp cuff. Rx faxed to . Check bp weekly, let us know if >140/90.   Follow-up: Return in about 2 weeks (around 11/21/2020) for HROB, with Dr Despina Hidden.   Future Appointments  Date Time Provider Department Center  11/14/2020  9:30 AM Cheral Marker, CNM [facility]  11/21/2020  8:30 AM [facility] [place] [facility] None  11/21/2020  9:30 AM Marny Lowenstein, PA-C [facility]  12/05/2020  8:30 AM [facility] [place] [facility] None  12/05/2020  9:30 AM Hermina Staggers, MD [facility]  12/12/2020  8:30 AM [facility] [place] [facility] None  12/12/2020  9:30 AM Lazaro Arms, MD [facility]  12/19/2020  8:30 AM [facility] [place]  [facility] None  12/19/2020  9:30 AM Lazaro Arms, MD [facility]  12/26/2020  8:30 AM [facility] [place] [facility] None  12/26/2020  9:30 AM Cheral Marker, CNM [facility]  01/02/2021  8:30 AM [facility] [place] [facility] None  01/02/2021  9:30 AM Lazaro Arms, MD [facility]  01/09/2021  8:30 AM [facility] [place] [facility] None  01/09/2021  9:30 AM Cheral Marker, CNM [facility]  01/16/2021  8:30 AM [facility] [place] [facility] None  01/16/2021  9:30 AM Lazaro Arms, MD [facility]  01/23/2021  9:00 AM [facility] [place] [facility] None  01/23/2021  9:50 AM Cheral Marker, CNM [facility]  01/30/2021  9:00 AM [facility] [place] [facility] None  01/30/2021  9:50 AM Burke Terry, Amaryllis Dyke, MD [facility]    Orders Placed This Encounter  Procedures  . POC Urinalysis Dipstick OB   Lazaro Arms  11/07/2020 10:39 AM

## 2020-11-11 DIAGNOSIS — O10919 Unspecified pre-existing hypertension complicating pregnancy, unspecified trimester: Secondary | ICD-10-CM

## 2020-11-11 DIAGNOSIS — O169 Unspecified maternal hypertension, unspecified trimester: Secondary | ICD-10-CM

## 2020-11-11 HISTORY — DX: Unspecified maternal hypertension, unspecified trimester: O16.9

## 2020-11-11 HISTORY — DX: Unspecified pre-existing hypertension complicating pregnancy, unspecified trimester: O10.919

## 2020-11-14 ENCOUNTER — Ambulatory Visit (INDEPENDENT_AMBULATORY_CARE_PROVIDER_SITE_OTHER): Payer: Medicaid Other | Admitting: Women's Health

## 2020-11-14 ENCOUNTER — Other Ambulatory Visit: Payer: Self-pay

## 2020-11-14 ENCOUNTER — Other Ambulatory Visit: Payer: Medicaid Other

## 2020-11-14 ENCOUNTER — Encounter: Payer: Self-pay | Admitting: Women's Health

## 2020-11-14 VITALS — BP 131/83 | HR 82 | Wt 267.8 lb

## 2020-11-14 DIAGNOSIS — Z1389 Encounter for screening for other disorder: Secondary | ICD-10-CM

## 2020-11-14 DIAGNOSIS — Z3A30 30 weeks gestation of pregnancy: Secondary | ICD-10-CM

## 2020-11-14 DIAGNOSIS — O10919 Unspecified pre-existing hypertension complicating pregnancy, unspecified trimester: Secondary | ICD-10-CM

## 2020-11-14 DIAGNOSIS — O0993 Supervision of high risk pregnancy, unspecified, third trimester: Secondary | ICD-10-CM

## 2020-11-14 DIAGNOSIS — O24410 Gestational diabetes mellitus in pregnancy, diet controlled: Secondary | ICD-10-CM

## 2020-11-14 LAB — POCT URINALYSIS DIPSTICK OB
Blood, UA: NEGATIVE
Glucose, UA: NEGATIVE
Ketones, UA: NEGATIVE
Leukocytes, UA: NEGATIVE
Nitrite, UA: NEGATIVE
POC,PROTEIN,UA: NEGATIVE

## 2020-11-14 NOTE — Patient Instructions (Signed)
Rebecca Frey, I greatly value your feedback.  If you receive a survey following your visit with [place] today, we appreciate you taking the time to fill it out.  Thanks, Joellyn Haff, CNM, WHNP-BC   [facility] ([address] [place], [place] [address]) Entrance C, located off of [address] valet parking  Go to [email or web address] to register for FREE online childbirth classes   Call the office [phone]) or go to [facility] if:  You begin to have strong, frequent contractions  Your water breaks.  Sometimes it is a big gush of fluid, sometimes it is just a trickle that keeps getting your panties wet or running down your legs  You have vaginal bleeding.  It is normal to have a small amount of spotting if your cervix was checked.   You don't feel your baby moving like normal.  If you don't, get you something to eat and drink and lay down and focus on feeling your baby move.  You should feel at least 10 movements in 2 hours.  If you don't, you should call the office or go to [facility].    Tdap Vaccine  It is recommended that you get the Tdap vaccine during the third trimester of EACH pregnancy to help protect your baby from getting pertussis (whooping cough)  27-36 weeks is the BEST time to do this so that you can pass the protection on to your baby. During pregnancy is better than after pregnancy, but if you are unable to get it during pregnancy it will be offered at the hospital.   You can get this vaccine with [place], at the health department, your family doctor, or some local pharmacies  Everyone who will be around your baby should also be up-to-date on their vaccines before the baby comes. Adults (who are not pregnant) only need 1 dose of Tdap during adulthood.   [place] Pediatricians/Family Doctors:  [facility] [phone]            [facility] [phone]                 [facility]  [phone] (usually not accepting new patients unless you have family there already, you are always welcome to call and ask)       [facility] Department [phone]       [facility] Pediatricians/Family Doctors:   [facility]: [phone]  [facility]: [phone]  [facility]: [phone]  [facility] Doctors:   [facility]: [phone]   [facility]: [phone]  [facility] Doctors:  [facility]: [phone]   Home Blood Pressure Monitoring for Patients   Your provider has recommended that you check your blood pressure (BP) at least once a week at home. If you do not have a blood pressure cuff at home, one will be provided for you. Contact your provider if you have not received your monitor within 1 week.   Helpful Tips for Accurate Home Blood Pressure Checks  . Don't smoke, exercise, or drink caffeine 30 minutes before checking your BP . Use the restroom before checking your BP (a full bladder can raise your pressure) . Relax in a comfortable upright chair . Feet on the ground . Left arm resting comfortably on a flat surface at the level of your heart . Legs uncrossed . Back supported . Sit quietly and don't talk . Place the cuff on your bare  arm . Adjust snuggly, so that only two fingertips can fit between your skin and the top of the cuff . Check 2 readings separated by at least one minute . Keep a log of your BP readings . For a visual, please reference this diagram: http://ccnc.care/bpdiagram  Provider Name: [facility]     Phone: [phone]  Zone 1: ALL CLEAR  Continue to monitor your symptoms:  . BP reading is less than 140 (top number) or less than 90 (bottom number)  . No right upper stomach pain . No headaches or seeing spots . No feeling nauseated or throwing up . No swelling in face and hands  Zone 2: CAUTION Call your  doctor's office for any of the following:  . BP reading is greater than 140 (top number) or greater than 90 (bottom number)  . Stomach pain under your ribs in the middle or right side . Headaches or seeing spots . Feeling nauseated or throwing up . Swelling in face and hands  Zone 3: EMERGENCY  Seek immediate medical care if you have any of the following:  . BP reading is greater than160 (top number) or greater than 110 (bottom number) . Severe headaches not improving with Tylenol . Serious difficulty catching your breath . Any worsening symptoms from Zone 2   Third Trimester of Pregnancy The third trimester is from week 29 through week 42, months 7 through 9. The third trimester is a time when the fetus is growing rapidly. At the end of the ninth month, the fetus is about 20 inches in length and weighs 6-10 pounds.  BODY CHANGES Your body goes through many changes during pregnancy. The changes vary from woman to woman.   Your weight will continue to increase. You can expect to gain 25-35 pounds (11-16 kg) by the end of the pregnancy.  You may begin to get stretch marks on your hips, abdomen, and breasts.  You may urinate more often because the fetus is moving lower into your pelvis and pressing on your bladder.  You may develop or continue to have heartburn as a result of your pregnancy.  You may develop constipation because certain hormones are causing the muscles that push waste through your intestines to slow down.  You may develop hemorrhoids or swollen, bulging veins (varicose veins).  You may have pelvic pain because of the weight gain and pregnancy hormones relaxing your joints between the bones in your pelvis. Backaches may result from overexertion of the muscles supporting your posture.  You may have changes in your hair. These can include thickening of your hair, rapid growth, and changes in texture. Some women also have hair loss during or after pregnancy, or hair that  feels dry or thin. Your hair will most likely return to normal after your baby is born.  Your breasts will continue to grow and be tender. A yellow discharge may leak from your breasts called colostrum.  Your belly button may stick out.  You may feel short of breath because of your expanding uterus.  You may notice the fetus "dropping," or moving lower in your abdomen.  You may have a bloody mucus discharge. This usually occurs a few days to a week before labor begins.  Your cervix becomes thin and soft (effaced) near your due date. WHAT TO EXPECT AT YOUR PRENATAL EXAMS  You will have prenatal exams every 2 weeks until week 36. Then, you will have weekly prenatal exams. During a routine prenatal visit:  You  will be weighed to make sure you and the fetus are growing normally.  Your blood pressure is taken.  Your abdomen will be measured to track your baby's growth.  The fetal heartbeat will be listened to.  Any test results from the previous visit will be discussed.  You may have a cervical check near your due date to see if you have effaced. At around 36 weeks, your caregiver will check your cervix. At the same time, your caregiver will also perform a test on the secretions of the vaginal tissue. This test is to determine if a type of bacteria, Group B streptococcus, is present. Your caregiver will explain this further. Your caregiver may ask you:  What your birth plan is.  How you are feeling.  If you are feeling the baby move.  If you have had any abnormal symptoms, such as leaking fluid, bleeding, severe headaches, or abdominal cramping.  If you have any questions. Other tests or screenings that may be performed during your third trimester include:  Blood tests that check for low iron levels (anemia).  Fetal testing to check the health, activity level, and growth of the fetus. Testing is done if you have certain medical conditions or if there are problems during the  pregnancy. FALSE LABOR You may feel small, irregular contractions that eventually go away. These are called Braxton Hicks contractions, or false labor. Contractions may last for hours, days, or even weeks before true labor sets in. If contractions come at regular intervals, intensify, or become painful, it is best to be seen by your caregiver.  SIGNS OF LABOR   Menstrual-like cramps.  Contractions that are 5 minutes apart or less.  Contractions that start on the top of the uterus and spread down to the lower abdomen and back.  A sense of increased pelvic pressure or back pain.  A watery or bloody mucus discharge that comes from the vagina. If you have any of these signs before the 37th week of pregnancy, call your caregiver right away. You need to go to the hospital to get checked immediately. HOME CARE INSTRUCTIONS   Avoid all smoking, herbs, alcohol, and unprescribed drugs. These chemicals affect the formation and growth of the baby.  Follow your caregiver's instructions regarding medicine use. There are medicines that are either safe or unsafe to take during pregnancy.  Exercise only as directed by your caregiver. Experiencing uterine cramps is a good sign to stop exercising.  Continue to eat regular, healthy meals.  Wear a good support bra for breast tenderness.  Do not use hot tubs, steam rooms, or saunas.  Wear your seat belt at all times when driving.  Avoid raw meat, uncooked cheese, cat litter boxes, and soil used by cats. These carry germs that can cause birth defects in the baby.  Take your prenatal vitamins.  Try taking a stool softener (if your caregiver approves) if you develop constipation. Eat more high-fiber foods, such as fresh vegetables or fruit and whole grains. Drink plenty of fluids to keep your urine clear or pale yellow.  Take warm sitz baths to soothe any pain or discomfort caused by hemorrhoids. Use hemorrhoid cream if your caregiver approves.  If you  develop varicose veins, wear support hose. Elevate your feet for 15 minutes, 3-4 times a day. Limit salt in your diet.  Avoid heavy lifting, wear low heal shoes, and practice good posture.  Rest a lot with your legs elevated if you have leg cramps or low back  pain.  Visit your dentist if you have not gone during your pregnancy. Use a soft toothbrush to brush your teeth and be gentle when you floss.  A sexual relationship may be continued unless your caregiver directs you otherwise.  Do not travel far distances unless it is absolutely necessary and only with the approval of your caregiver.  Take prenatal classes to understand, practice, and ask questions about the labor and delivery.  Make a trial run to the hospital.  Pack your hospital bag.  Prepare the baby's nursery.  Continue to go to all your prenatal visits as directed by your caregiver. SEEK MEDICAL CARE IF:  You are unsure if you are in labor or if your water has broken.  You have dizziness.  You have mild pelvic cramps, pelvic pressure, or nagging pain in your abdominal area.  You have persistent nausea, vomiting, or diarrhea.  You have a bad smelling vaginal discharge.  You have pain with urination. SEEK IMMEDIATE MEDICAL CARE IF:   You have a fever.  You are leaking fluid from your vagina.  You have spotting or bleeding from your vagina.  You have severe abdominal cramping or pain.  You have rapid weight loss or gain.  You have shortness of breath with chest pain.  You notice sudden or extreme swelling of your face, hands, ankles, feet, or legs.  You have not felt your baby move in over an hour.  You have severe headaches that do not go away with medicine.  You have vision changes. Document Released: 09/21/2001 Document Revised: 10/02/2013 Document Reviewed: 11/28/2012 [facility] Patient Information 2015 [place], [place]. This information is not intended to replace advice given to you by your health  care provider. Make sure you discuss any questions you have with your health care provider.

## 2020-11-14 NOTE — Progress Notes (Signed)
HIGH-RISK PREGNANCY VISIT Patient name: Rebecca Frey MRN [id number]  Date of birth: [birth year] Chief Complaint:   Routine Prenatal Visit and High Risk Gestation  History of Present Illness:   Rebecca Frey is a 30 y.o. G1P0 female at [redacted]w[redacted]d with an Estimated Date of Delivery: 01/23/21 being seen today for ongoing management of a high-risk pregnancy complicated by chronic hypertension currently on no meds and diabetes mellitus A1DM.  Today she reports FBS 80-90, 2hr pp 95-140 (majority <120, did have a 201 after pancakes-no longer eating pancakes).  Depression screen [facility] 2/9 10/29/2020 10/24/2020 07/17/2020 06/19/2020  Decreased Interest 0 1 2 1   Down, Depressed, Hopeless 0 1 1 0  PHQ - 2 Score 0 2 3 1   Altered sleeping - 2 2 3   Tired, decreased energy - 3 2 3   Change in appetite - 2 3 2   Feeling bad or failure about yourself  - 2 0 0  Trouble concentrating - 2 3 3   Moving slowly or fidgety/restless - 0 0 0  Suicidal thoughts - 0 0 0  PHQ-9 Score - 13 13 12   Difficult doing work/chores - - - Not difficult at all    Contractions: Not present. Vag. Bleeding: None.  Movement: Present. denies leaking of fluid.  Review of Systems:   Pertinent items are noted in HPI Denies abnormal vaginal discharge w/ itching/odor/irritation, headaches, visual changes, shortness of breath, chest pain, abdominal pain, severe nausea/vomiting, or problems with urination or bowel movements unless otherwise stated above. Pertinent History Reviewed:  Reviewed past medical,surgical, social, obstetrical and family history.  Reviewed problem list, medications and allergies. Physical Assessment:   Vitals:   11/14/20 0951  BP: 131/83  Pulse: 82  Weight: 267 lb 12.8 oz (121.5 kg)  Body mass index is 45.97 kg/m.           Physical Examination:   General appearance: alert, well appearing, and in no distress  Mental status: alert, oriented to person, place, and time  Skin: warm & dry   Extremities: Edema: Trace     Cardiovascular: normal heart rate noted  Respiratory: normal respiratory effort, no distress  Abdomen: gravid, soft, non-tender  Pelvic: Cervical exam deferred         Fetal Status: Fetal Heart Rate (bpm): 133 Fundal Height: 30 cm Movement: Present    Fetal Surveillance Testing today: doppler   Chaperone: N/A    Results for orders placed or performed in visit on 11/14/20 (from the past 24 hour(s))  POC Urinalysis Dipstick OB   Collection Time: 11/14/20  9:50 AM  Result Value Ref Range   Color, UA     Clarity, UA     Glucose, UA Negative Negative   Bilirubin, UA     Ketones, UA neg    Spec Grav, UA     Blood, UA neg    pH, UA     POC,PROTEIN,UA Negative Negative, Trace, Small (1+), Moderate (2+), Large (3+), 4+   Urobilinogen, UA     Nitrite, UA neg    Leukocytes, UA Negative Negative   Appearance     Odor      Assessment & Plan:  High-risk pregnancy: G1P0 at [redacted]w[redacted]d with an Estimated Date of Delivery: 01/23/21   1) CHTN, stable, no meds  2) A1DM, stable  3) Elevated AFP  4) Resolved FGR> 7.14% at 24wks, 18.5% at 28wks  Meds: No orders of the defined types were placed in this encounter.   Labs/procedures today: dopp  Treatment Plan:  U/S q4wks     2x/wk testing @ 32wks    IOL @ 39wks   Reviewed: Preterm labor symptoms and general obstetric precautions including but not limited to vaginal bleeding, contractions, leaking of fluid and fetal movement were reviewed in detail with the patient.  All questions were answered. Has home bp cuff. Check bp weekly, let us know if >140/90.   Follow-up: Return in about 2 weeks (around 11/28/2020) for HROB, US:EFW, [place]: BPP/Dopp, MD or CNM, in person (next week on 2/11 as scheduled).   Future Appointments  Date Time Provider Department Center  11/21/2020  9:30 AM Marny Lowenstein, PA-C [facility]  12/05/2020  8:30 AM [facility] [place] [facility] None  12/05/2020  9:30 AM Hermina Staggers, MD [facility]  12/12/2020  8:30 AM [facility] [place] [facility] None  12/12/2020  9:30 AM Lazaro Arms, MD [facility]  12/19/2020  8:30 AM [facility] [place] [facility] None  12/19/2020  9:30 AM Lazaro Arms, MD [facility]  12/26/2020  8:30 AM [facility] [place] [facility] None  12/26/2020  9:30 AM Cheral Marker, CNM [facility]  01/02/2021  8:30 AM [facility] [place] [facility] None  01/02/2021  9:30 AM Lazaro Arms, MD [facility]  01/09/2021  8:30 AM [facility] [place] [facility] None  01/09/2021  9:30 AM Cheral Marker, CNM [facility]  01/16/2021  8:30 AM [facility] [place] [facility] None  01/16/2021  9:30 AM Lazaro Arms, MD [facility]  01/23/2021  9:00 AM [facility] [place] [facility] None  01/23/2021  9:50 AM Cheral Marker, CNM [facility]  01/30/2021  9:00 AM [facility] [place] [facility] None  01/30/2021  9:50 AM Eure, Amaryllis Dyke, MD [facility]    Orders Placed This Encounter  Procedures  . US FETAL BPP WO NON STRESS  . [place] UA Cord Doppler  . US OB Follow Up  . POC Urinalysis Dipstick OB   Cheral Marker CNM, [facility] 11/14/2020 2:46 PM

## 2020-11-15 DIAGNOSIS — N939 Abnormal uterine and vaginal bleeding, unspecified: Secondary | ICD-10-CM | POA: Diagnosis not present

## 2020-11-17 ENCOUNTER — Telehealth: Payer: Self-pay

## 2020-11-17 NOTE — Telephone Encounter (Signed)
Pt called stating she was seen at the ED at [facility] over the weekend, she stated that the ED wanted her to call the office for a follow up.

## 2020-11-17 NOTE — Telephone Encounter (Signed)
Replied to patient's mychart message.

## 2020-11-18 ENCOUNTER — Other Ambulatory Visit: Payer: Self-pay

## 2020-11-18 ENCOUNTER — Inpatient Hospital Stay (HOSPITAL_COMMUNITY)
Admission: EM | Admit: 2020-11-18 | Discharge: 2020-12-01 | DRG: 788 | Disposition: A | Discharge status: Home / Self Care | Payer: Medicaid Other | Attending: Obstetrics and Gynecology | Admitting: Obstetrics and Gynecology

## 2020-11-18 ENCOUNTER — Encounter (HOSPITAL_COMMUNITY): Payer: Self-pay | Admitting: Emergency Medicine

## 2020-11-18 DIAGNOSIS — O24420 Gestational diabetes mellitus in childbirth, diet controlled: Secondary | ICD-10-CM | POA: Diagnosis not present

## 2020-11-18 DIAGNOSIS — D649 Anemia, unspecified: Secondary | ICD-10-CM | POA: Diagnosis not present

## 2020-11-18 DIAGNOSIS — O169 Unspecified maternal hypertension, unspecified trimester: Secondary | ICD-10-CM

## 2020-11-18 DIAGNOSIS — Z87891 Personal history of nicotine dependence: Secondary | ICD-10-CM

## 2020-11-18 DIAGNOSIS — Z6791 Unspecified blood type, Rh negative: Secondary | ICD-10-CM

## 2020-11-18 DIAGNOSIS — O24414 Gestational diabetes mellitus in pregnancy, insulin controlled: Secondary | ICD-10-CM | POA: Diagnosis not present

## 2020-11-18 DIAGNOSIS — O4693 Antepartum hemorrhage, unspecified, third trimester: Secondary | ICD-10-CM | POA: Diagnosis not present

## 2020-11-18 DIAGNOSIS — O4593 Premature separation of placenta, unspecified, third trimester: Secondary | ICD-10-CM | POA: Diagnosis not present

## 2020-11-18 DIAGNOSIS — O365930 Maternal care for other known or suspected poor fetal growth, third trimester, not applicable or unspecified: Secondary | ICD-10-CM | POA: Diagnosis not present

## 2020-11-18 DIAGNOSIS — O360130 Maternal care for anti-D [Rh] antibodies, third trimester, not applicable or unspecified: Secondary | ICD-10-CM | POA: Diagnosis not present

## 2020-11-18 DIAGNOSIS — O1002 Pre-existing essential hypertension complicating childbirth: Secondary | ICD-10-CM | POA: Diagnosis not present

## 2020-11-18 DIAGNOSIS — O9902 Anemia complicating childbirth: Secondary | ICD-10-CM | POA: Diagnosis present

## 2020-11-18 DIAGNOSIS — O24410 Gestational diabetes mellitus in pregnancy, diet controlled: Secondary | ICD-10-CM | POA: Diagnosis not present

## 2020-11-18 DIAGNOSIS — O1493 Unspecified pre-eclampsia, third trimester: Secondary | ICD-10-CM | POA: Diagnosis not present

## 2020-11-18 DIAGNOSIS — O99214 Obesity complicating childbirth: Secondary | ICD-10-CM | POA: Diagnosis not present

## 2020-11-18 DIAGNOSIS — O99013 Anemia complicating pregnancy, third trimester: Secondary | ICD-10-CM | POA: Diagnosis not present

## 2020-11-18 DIAGNOSIS — Z3A30 30 weeks gestation of pregnancy: Secondary | ICD-10-CM

## 2020-11-18 DIAGNOSIS — O119 Pre-existing hypertension with pre-eclampsia, unspecified trimester: Secondary | ICD-10-CM | POA: Diagnosis present

## 2020-11-18 DIAGNOSIS — O26899 Other specified pregnancy related conditions, unspecified trimester: Secondary | ICD-10-CM

## 2020-11-18 DIAGNOSIS — O10013 Pre-existing essential hypertension complicating pregnancy, third trimester: Secondary | ICD-10-CM | POA: Diagnosis not present

## 2020-11-18 DIAGNOSIS — Z3A31 31 weeks gestation of pregnancy: Secondary | ICD-10-CM | POA: Diagnosis not present

## 2020-11-18 DIAGNOSIS — O1092 Unspecified pre-existing hypertension complicating childbirth: Secondary | ICD-10-CM | POA: Diagnosis not present

## 2020-11-18 DIAGNOSIS — Z20822 Contact with and (suspected) exposure to covid-19: Secondary | ICD-10-CM | POA: Diagnosis present

## 2020-11-18 DIAGNOSIS — O365990 Maternal care for other known or suspected poor fetal growth, unspecified trimester, not applicable or unspecified: Secondary | ICD-10-CM

## 2020-11-18 DIAGNOSIS — I10 Essential (primary) hypertension: Secondary | ICD-10-CM | POA: Diagnosis not present

## 2020-11-18 DIAGNOSIS — O322XX0 Maternal care for transverse and oblique lie, not applicable or unspecified: Secondary | ICD-10-CM | POA: Diagnosis not present

## 2020-11-18 DIAGNOSIS — Z3A32 32 weeks gestation of pregnancy: Secondary | ICD-10-CM | POA: Diagnosis not present

## 2020-11-18 DIAGNOSIS — O114 Pre-existing hypertension with pre-eclampsia, complicating childbirth: Principal | ICD-10-CM | POA: Diagnosis present

## 2020-11-18 DIAGNOSIS — O26893 Other specified pregnancy related conditions, third trimester: Secondary | ICD-10-CM | POA: Diagnosis present

## 2020-11-18 DIAGNOSIS — O1413 Severe pre-eclampsia, third trimester: Secondary | ICD-10-CM | POA: Diagnosis not present

## 2020-11-18 DIAGNOSIS — O24419 Gestational diabetes mellitus in pregnancy, unspecified control: Secondary | ICD-10-CM | POA: Diagnosis present

## 2020-11-18 DIAGNOSIS — O113 Pre-existing hypertension with pre-eclampsia, third trimester: Secondary | ICD-10-CM | POA: Diagnosis not present

## 2020-11-18 DIAGNOSIS — R7401 Elevation of levels of liver transaminase levels: Secondary | ICD-10-CM | POA: Diagnosis not present

## 2020-11-18 LAB — CBC WITH DIFFERENTIAL/PLATELET
Abs Immature Granulocytes: 0.05 10*3/uL (ref 0.00–0.07)
Basophils Absolute: 0 10*3/uL (ref 0.0–0.1)
Basophils Relative: 0 %
Eosinophils Absolute: 0.1 10*3/uL (ref 0.0–0.5)
Eosinophils Relative: 1 %
HCT: 34.6 % — ABNORMAL LOW (ref 36.0–46.0)
Hemoglobin: 11.5 g/dL — ABNORMAL LOW (ref 12.0–15.0)
Immature Granulocytes: 0 %
Lymphocytes Relative: 21 %
Lymphs Abs: 2.5 10*3/uL (ref 0.7–4.0)
MCH: 29.7 pg (ref 26.0–34.0)
MCHC: 33.2 g/dL (ref 30.0–36.0)
MCV: 89.4 fL (ref 80.0–100.0)
Monocytes Absolute: 1 10*3/uL (ref 0.1–1.0)
Monocytes Relative: 8 %
Neutro Abs: 8.5 10*3/uL — ABNORMAL HIGH (ref 1.7–7.7)
Neutrophils Relative %: 70 %
Platelets: 198 10*3/uL (ref 150–400)
RBC: 3.87 MIL/uL (ref 3.87–5.11)
RDW: 14.9 % (ref 11.5–15.5)
WBC: 12.2 10*3/uL — ABNORMAL HIGH (ref 4.0–10.5)
nRBC: 0 % (ref 0.0–0.2)

## 2020-11-18 LAB — URINALYSIS, ROUTINE W REFLEX MICROSCOPIC
Bilirubin Urine: NEGATIVE
Glucose, UA: NEGATIVE mg/dL
Ketones, ur: NEGATIVE mg/dL
Leukocytes,Ua: NEGATIVE
Nitrite: NEGATIVE
Protein, ur: 30 mg/dL — AB
Specific Gravity, Urine: 1.012 (ref 1.005–1.030)
pH: 7 (ref 5.0–8.0)

## 2020-11-18 LAB — PROTEIN / CREATININE RATIO, URINE
Creatinine, Urine: 114.39 mg/dL
Protein Creatinine Ratio: 0.4 mg/mg{Cre} — ABNORMAL HIGH (ref 0.00–0.15)
Total Protein, Urine: 46 mg/dL

## 2020-11-18 LAB — COMPREHENSIVE METABOLIC PANEL
ALT: 23 U/L (ref 0–44)
AST: 27 U/L (ref 15–41)
Albumin: 2.7 g/dL — ABNORMAL LOW (ref 3.5–5.0)
Alkaline Phosphatase: 98 U/L (ref 38–126)
Anion gap: 6 (ref 5–15)
BUN: 7 mg/dL (ref 6–20)
CO2: 22 mmol/L (ref 22–32)
Calcium: 8.9 mg/dL (ref 8.9–10.3)
Chloride: 107 mmol/L (ref 98–111)
Creatinine, Ser: 0.64 mg/dL (ref 0.44–1.00)
GFR, Estimated: >60 mL/min (ref >60)
Glucose, Bld: 92 mg/dL (ref 70–99)
Potassium: 3.6 mmol/L (ref 3.5–5.1)
Sodium: 135 mmol/L (ref 135–145)
Total Bilirubin: 0.4 mg/dL (ref 0.3–1.2)
Total Protein: 6.9 g/dL (ref 6.5–8.1)

## 2020-11-18 LAB — TYPE AND SCREEN
ABO/RH(D): A NEG
Antibody Screen: POSITIVE

## 2020-11-18 LAB — GLUCOSE, CAPILLARY
Glucose-Capillary: 86 mg/dL (ref 70–99)
Glucose-Capillary: 98 mg/dL (ref 70–99)
Glucose-Capillary: 123 mg/dL — ABNORMAL HIGH (ref 70–99)
Glucose-Capillary: 145 mg/dL — ABNORMAL HIGH (ref 70–99)

## 2020-11-18 LAB — SARS CORONAVIRUS 2 BY RT PCR (HOSPITAL ORDER, PERFORMED IN ~~LOC~~ HOSPITAL LAB): SARS Coronavirus 2: NEGATIVE

## 2020-11-18 MED ORDER — ACETAMINOPHEN 325 MG PO TABS
650.0000 mg | ORAL_TABLET | ORAL | Status: DC | PRN
  Administered 2020-11-22 – 2020-11-24 (×2): 650 mg via ORAL
  Filled 2020-11-18 (×2): qty 2

## 2020-11-18 MED ORDER — PRENATAL MULTIVITAMIN CH
1.0000 | ORAL_TABLET | Freq: Every day | ORAL | Status: DC
Start: 2020-11-18 — End: 2020-11-27
  Administered 2020-11-18 – 2020-11-27 (×10): 1 via ORAL
  Filled 2020-11-18 (×10): qty 1

## 2020-11-18 MED ORDER — BETAMETHASONE SOD PHOS & ACET 6 (3-3) MG/ML IJ SUSP
12.0000 mg | INTRAMUSCULAR | Status: AC
  Administered 2020-11-18 – 2020-11-19 (×2): 12 mg via INTRAMUSCULAR
  Filled 2020-11-18: qty 5

## 2020-11-18 MED ORDER — MAGNESIUM SULFATE BOLUS VIA INFUSION
4.0000 g | Freq: Once | INTRAVENOUS | Status: AC
  Administered 2020-11-18: 4 g via INTRAVENOUS
  Filled 2020-11-18: qty 1000

## 2020-11-18 MED ORDER — LACTATED RINGERS IV SOLN: INTRAVENOUS | Status: DC

## 2020-11-18 MED ORDER — ZOLPIDEM TARTRATE 5 MG PO TABS: 5.0000 mg | ORAL_TABLET | Freq: Every evening | ORAL | Status: DC | PRN

## 2020-11-18 MED ORDER — DOCUSATE SODIUM 100 MG PO CAPS
100.0000 mg | ORAL_CAPSULE | Freq: Every day | ORAL | Status: DC
  Administered 2020-11-19 – 2020-11-23 (×5): 100 mg via ORAL
  Filled 2020-11-18 (×7): qty 1

## 2020-11-18 MED ORDER — CALCIUM CARBONATE ANTACID 500 MG PO CHEW
2.0000 | CHEWABLE_TABLET | ORAL | Status: DC | PRN
  Administered 2020-11-22 – 2020-11-26 (×3): 400 mg via ORAL
  Filled 2020-11-18 (×3): qty 2

## 2020-11-18 MED ORDER — LABETALOL HCL 5 MG/ML IV SOLN
10.0000 mg | INTRAVENOUS | Status: DC | PRN
  Administered 2020-11-18 (×2): 10 mg via INTRAVENOUS
  Filled 2020-11-18: qty 4

## 2020-11-18 MED ORDER — LABETALOL HCL 5 MG/ML IV SOLN
40.0000 mg | INTRAVENOUS | Status: DC | PRN
  Administered 2020-11-26: 40 mg via INTRAVENOUS
  Filled 2020-11-18: qty 8

## 2020-11-18 MED ORDER — ACETAMINOPHEN 500 MG PO TABS
1000.0000 mg | ORAL_TABLET | Freq: Once | ORAL | Status: AC
  Administered 2020-11-18: 1000 mg via ORAL
  Filled 2020-11-18: qty 2

## 2020-11-18 MED ORDER — MAGNESIUM SULFATE 40 GM/1000ML IV SOLN
2.0000 g/h | INTRAVENOUS | Status: DC
Start: 2020-11-18 — End: 2020-11-19
  Administered 2020-11-18 (×2): 2 g/h via INTRAVENOUS
  Filled 2020-11-18 (×2): qty 1000

## 2020-11-18 MED ORDER — HYDRALAZINE HCL 20 MG/ML IJ SOLN: 10.0000 mg | INTRAMUSCULAR | Status: DC | PRN

## 2020-11-18 MED ORDER — LABETALOL HCL 5 MG/ML IV SOLN
20.0000 mg | INTRAVENOUS | Status: DC | PRN
  Administered 2020-11-25 – 2020-11-26 (×2): 20 mg via INTRAVENOUS
  Filled 2020-11-18 (×2): qty 4

## 2020-11-18 MED ORDER — INSULIN ASPART 100 UNIT/ML ~~LOC~~ SOLN
0.0000 [IU] | Freq: Three times a day (TID) | SUBCUTANEOUS | Status: DC
  Administered 2020-11-18 – 2020-11-19 (×2): 3 [IU] via SUBCUTANEOUS

## 2020-11-18 MED ORDER — LABETALOL HCL 5 MG/ML IV SOLN
80.0000 mg | INTRAVENOUS | Status: DC | PRN
  Filled 2020-11-18: qty 16

## 2020-11-18 NOTE — ED Notes (Signed)
Pt placed on  OB QS Monitor. Rapid Response nurse @ [facility] notified.

## 2020-11-18 NOTE — ED Provider Notes (Signed)
[facility] EMERGENCY DEPARTMENT Provider Note   CSN: [id number] Arrival date & time: 11/18/20  0129   History Chief Complaint  Patient presents with  . Hypertension    Rebecca Frey is a 30 y.o. female.  The history is provided by the patient.  Hypertension  She has history of hypertension and is currently pregnant [redacted] weeks 4 days and comes in because of elevated blood pressure reading at home.  She has been followed at her obstetrician's office for elevated blood pressures, but majority of the readings have been under 150.  Tonight, blood pressure was 197/111.  She does have a mild headache.  She has noted normal fetal movement.  She had been seen at another emergency department last week for some vaginal bleeding, but there is no bleeding tonight.  She denies any contractions or abdominal pain.  She has some mild swelling of ankles and feet which is not any worse than it has been.  She is gravida 1, para 0.  Past Medical History:  Diagnosis Date  . Diabetes mellitus without complication ([place])   . Medical history non-contributory     Patient Active Problem List   Diagnosis Date Noted  . Gestational diabetes 10/27/2020  . Fetal growth retardation, antenatal 10/08/2020  . Chronic hypertension during pregnancy, antepartum 10/08/2020  . Low-lying placenta 09/10/2020  . Elevated AFP 09/10/2020  . Rh negative state in antepartum period 08/13/2020  . Supervision of high-risk pregnancy 07/15/2020    Past Surgical History:  Procedure Laterality Date  . NO PAST SURGERIES       OB History    Gravida  1   Para      Term      Preterm      AB      Living        SAB      IAB      Ectopic      Multiple      Live Births              Family History  Problem Relation Age of Onset  . Cancer Paternal Grandmother        colon  . Hypertension Mother   . Cancer Maternal Aunt        breast  . Diabetes Maternal Aunt   . Thyroid disease Maternal Aunt      Social History   Tobacco Use  . Smoking status: Former Smoker    Types: Cigarettes    Quit date: 06/05/2020    Years since quitting: 0.4  . Smokeless tobacco: Never Used  Vaping Use  . Vaping Use: Never used  Substance Use Topics  . Alcohol use: Not Currently    Comment: last used 2 months ago  . Drug use: No    Home Medications Prior to Admission medications   Medication Sig Start Date End Date Taking? Authorizing Provider  Accu-Chek Softclix Lancets lancets Use as instructed to check blood sugar 4 times daily 10/27/20   Florian Buff, MD  aspirin 81 MG chewable tablet Chew 2 tablets (162 mg total) by mouth daily. 10/08/20   Myrtis Ser, CNM  Blood Glucose Monitoring Suppl (ACCU-CHEK GUIDE ME) w/Device KIT 1 each by Does not apply route 4 (four) times daily. 10/27/20   Florian Buff, MD  Blood Pressure Monitor MISC For regular home bp monitoring during pregnancy 07/17/20   Christin Fudge, CNM  glucose blood test strip Use as instructed 10/27/20   Tania Ade  Lemmie Evens, MD  Pediatric Multivitamins-Iron [facility] COMPLETE) 18 MG CHEW Take 2 daily 06/19/20   Estill Dooms, NP    Allergies    Cinnamon, Coconut flavor, and Latex  Review of Systems   Review of Systems  All other systems reviewed and are negative.   Physical Exam Updated Vital Signs BP (!) 173/98 (BP Location: Left Arm)   Pulse 74   Temp 99.2 F (37.3 C) (Oral)   Resp 18   Ht $R'5\' 4"'Xc$  (1.626 m)   Wt 121.6 kg   LMP 03/19/2020   SpO2 99%   BMI 46.00 kg/m   Physical Exam Vitals and nursing note reviewed.   30 year old female, resting comfortably and in no acute distress. Vital signs are significant for elevated blood pressure. Oxygen saturation is 99%, which is normal. Head is normocephalic and atraumatic. PERRLA, EOMI. Oropharynx is clear. Neck is nontender and supple without adenopathy or JVD. Back is nontender and there is no CVA tenderness. Lungs are clear without rales, wheezes, or  rhonchi. Chest is nontender. Heart has regular rate and rhythm without murmur. Abdomen is soft, nontender.  Gravid uterus is present and nontender and size consistent with dates. Extremities have 1-2+ edema, full range of motion is present. Skin is warm and dry without rash. Neurologic: Mental status is normal, cranial nerves are intact, there are no motor or sensory deficits.  There is no clonus.  ED Results / Procedures / Treatments   Labs (all labs ordered are listed, but only abnormal results are displayed) Labs Reviewed  URINALYSIS, ROUTINE W REFLEX MICROSCOPIC  COMPREHENSIVE METABOLIC PANEL  CBC WITH DIFFERENTIAL/PLATELET    EKG EKG Interpretation  Date/Time:  Tuesday November 18 2020 01:48:32 EST Ventricular Rate:  79 PR Interval:    QRS Duration: 102 QT Interval:  388 QTC Calculation: 445 R Axis:   37 Text Interpretation: Sinus rhythm Normal ECG No old tracing to compare Confirmed by Delora Fuel ([id number]) on 11/18/2020 1:58:13 AM  Procedures Procedures  CRITICAL CARE Performed by: Delora Fuel Total critical care time: 40 minutes Critical care time was exclusive of separately billable procedures and treating other patients. Critical care was necessary to treat or prevent imminent or life-threatening deterioration. Critical care was time spent personally by me on the following activities: development of treatment plan with patient and/or surrogate as well as nursing, discussions with consultants, evaluation of patient's response to treatment, examination of patient, obtaining history from patient or surrogate, ordering and performing treatments and interventions, ordering and review of laboratory studies, ordering and review of radiographic studies, pulse oximetry and re-evaluation of patient's condition.  Medications Ordered in ED Medications  labetalol (NORMODYNE) injection 10 mg (has no administration in time range)    ED Course  I have reviewed the triage vital  signs and the nursing notes.  Pertinent lab results that were available during my care of the patient were reviewed by me and considered in my medical decision making (see chart for details).  MDM Rules/Calculators/A&P Elevated blood pressure in third trimester pregnancy.  With presence of peripheral edema, concern for preeclampsia.  She is given a dose of labetalol for her blood pressure and screening labs obtained.  Review of past records confirms [facility].  Urine on 1/28 had trace protein, on 2/4 was negative for protein.  Blood pressure at last office visit was 131/83.  Blood pressure was controlled with intravenous labetalol in the ED.  Labs show mild anemia  which is expected for this stage of pregnancy.  ECG is normal.  Urinalysis does show protein consistent with preeclampsia.  She has been kept on a fetal monitor.  Labs and monitor have been reviewed by Dr. Roselie Awkward at the maternal admissions unit and request patient be transferred there for observation.  Final Clinical Impression(s) / ED Diagnoses Final diagnoses:  Pre-eclampsia during pregnancy in third trimester, antepartum  Normochromic normocytic anemia    Rx / DC Orders ED Discharge Orders    None       Delora Fuel, MD 46/19/01 0400

## 2020-11-18 NOTE — Progress Notes (Addendum)
Updated Dr. Debroah Loop with patients lab results. He requested that the patient be transferred to MAU. [facility] ED RN notified.

## 2020-11-18 NOTE — ED Triage Notes (Addendum)
Pt here for hypertension. Pt is [redacted]weeks pregnant and is being followed for her HTN @ [facility]. Pt states her BP @ home was 197/111 @ 0100. Pt states she is also having chest pain but points to epigastric area. Pt states she took 2 baby ASA prior to arrival. Denies any other issues.

## 2020-11-18 NOTE — H&P (Signed)
Rebecca Frey is a 30 y.o. female presenting for Chronic Hypertension with Superimposed Preeclampsia. At time of admission she reports minor headache but states she was sobbing during her transfer to [facility] from [facility]. She denies visual disturbances, RUQ/epigastric pain, new onset swelling or weight gain.  Patient was evaluated for vaginal bleeding at [facility] last weekend but denies vaginal bleeding since that encounter. She also denies leaking of fluid, DFM, fever or recent illness.  She receives prenatal care with [facility].  OB History    Gravida  1   Para      Term      Preterm      AB      Living        SAB      IAB      Ectopic      Multiple      Live Births             Past Medical History:  Diagnosis Date  . Diabetes mellitus without complication ([facility])   . Hypertension affecting pregnancy 11/2020  . Medical history non-contributory    Past Surgical History:  Procedure Laterality Date  . NO PAST SURGERIES     Family History: family history includes Cancer in her maternal aunt and paternal grandmother; Diabetes in her maternal aunt; Hypertension in her mother; Thyroid disease in her maternal aunt. Social History:  reports that she quit smoking about 5 months ago. Her smoking use included cigarettes. She has never used smokeless tobacco. She reports previous alcohol use. She reports that she does not use drugs.     Maternal Diabetes: Yes:  Diabetes Type:  Diet controlled Genetic Screening: Normal Maternal Ultrasounds/Referrals: Other:low lying placenta, resolved per imaging 10/31/2020 Fetal Ultrasounds or other Referrals:  None Maternal Substance Abuse:  No Significant Maternal Medications:  None Significant Maternal Lab Results:  Other: Elevated AFP Other Comments:  None  Review of Systems  Eyes: Negative for visual disturbance.  Gastrointestinal: Negative for abdominal pain.  Genitourinary: Negative for vaginal bleeding.  Neurological:  Positive for headaches.  All other systems reviewed and are negative.    Blood pressure (!) 155/98, pulse 74, temperature 98.9 F (37.2 C), temperature source Oral, resp. rate 19, height 5\' 4"  (1.626 m), weight 121.6 kg, last menstrual period 03/19/2020, SpO2 100 %.   Patient Vitals for the past 24 hrs:  BP Temp Temp src Pulse Resp SpO2 Height Weight  11/18/20 0603 (!) 141/76 98.9 F (37.2 C) Oral 81 20 100 % - -  11/18/20 0531 (!) 155/98 98.9 F (37.2 C) Oral 74 19 100 % - -  11/18/20 0445 (!) 162/92 - - 77 12 100 % - -  11/18/20 0430 (!) 156/91 - - 72 17 100 % - -  11/18/20 0400 (!) 158/93 - - 76 15 100 % - -  11/18/20 0340 (!) 154/87 - - 78 11 100 % - -  11/18/20 0335 (!) 162/103 - - 71 (!) 22 100 % - -  11/18/20 0320 (!) 145/91 - - 69 17 100 % - -  11/18/20 0310 (!) 144/90 - - 67 15 100 % - -  11/18/20 0300 137/88 - - 67 17 99 % - -  11/18/20 0250 (!) 138/92 - - 65 19 99 % - -  11/18/20 0240 140/87 - - 70 (!) 23 100 % - -  11/18/20 0230 (!) 142/91 - - 69 19 100 % - -  11/18/20 0210 (!) 162/101 - -  77 20 100 % - -  11/18/20 0140 - - - - 18 - - -  11/18/20 0139 (!) 173/98 99.2 F (37.3 C) Oral 74 18 99 % - -  11/18/20 0135 - - - - - - 5\' 4"  (1.626 m) 121.6 kg    Physical Exam Vitals and nursing note reviewed. Exam conducted with a chaperone present.  Cardiovascular:     Rate and Rhythm: Normal rate.     Pulses: Normal pulses.  Pulmonary:     Effort: Pulmonary effort is normal.     Breath sounds: Normal breath sounds.  Abdominal:     Comments: Gravid  Skin:    Capillary Refill: Capillary refill takes less than 2 seconds.  Neurological:     Mental Status: She is oriented to person, place, and time.  Psychiatric:        Mood and Affect: Mood normal.        Behavior: Behavior normal.        Thought Content: Thought content normal.        Judgment: Judgment normal.     Prenatal labs: ABO, Rh: A/Negative/-- (10/07 1651) Antibody: Negative (01/14 0857) Rubella: 7.22  (10/07 1651) RPR: Non Reactive (01/14 0857)  HBsAg: Negative (10/07 1651)  HIV: Non Reactive (01/14 0857)  GBS:   Unknown  Assessment/Plan: --30 y.o. G1P0 at [redacted]w[redacted]d  --Chronic HTN with SIPC --Multiple severe range BPs prior to transfer --Magnesium Sulfate initiated in MAU --A1GDM --S/p Rhogam 10/31/2020 in office --Admission coordinated with Dr. 11/02/2020, CNM 11/18/2020, 6:11 AM

## 2020-11-18 NOTE — MAU Note (Signed)
Pt presents to MAU via [facility] transportation from [facility] for elevated blood pressures.  Pt denies blurred vision, c/o generalized constant 2/10 abdominal pain and tightness beginning around 23:00 pm yesterday.  Endorses +FM.

## 2020-11-18 NOTE — Plan of Care (Signed)
  Problem: Education: Goal: Knowledge of General Education information will improve Description: Including pain rating scale, medication(s)/side effects and non-pharmacologic comfort measures Outcome: Progressing   Problem: Health Behavior/Discharge Planning: Goal: Ability to manage health-related needs will improve Outcome: Progressing   Problem: Clinical Measurements: Goal: Ability to maintain clinical measurements within normal limits will improve Outcome: Progressing Goal: Will remain free from infection Outcome: Progressing   Problem: Activity: Goal: Risk for activity intolerance will decrease Outcome: Progressing   Problem: Nutrition: Goal: Adequate nutrition will be maintained Outcome: Progressing   Problem: Coping: Goal: Level of anxiety will decrease Outcome: Progressing   Problem: Pain Managment: Goal: General experience of comfort will improve Outcome: Progressing   Problem: Education: Goal: Knowledge of disease or condition will improve Outcome: Progressing Goal: Knowledge of the prescribed therapeutic regimen will improve Outcome: Progressing   Problem: Education: Goal: Knowledge of disease or condition will improve Outcome: Progressing Goal: Knowledge of the prescribed therapeutic regimen will improve Outcome: Progressing

## 2020-11-18 NOTE — Progress Notes (Signed)
Notified by RN at [facility] ED of patients arrival and complaint of CHTN. Patient put on monitor. Dr. Debroah Loop notified of patient arrival and complaint. Dr. Debroah Loop ordered a CBC, CMP, and Protein Creat. Ratio.

## 2020-11-19 ENCOUNTER — Telehealth: Payer: Self-pay | Admitting: Obstetrics & Gynecology

## 2020-11-19 DIAGNOSIS — O112 Pre-existing hypertension with pre-eclampsia, second trimester: Secondary | ICD-10-CM | POA: Diagnosis not present

## 2020-11-19 DIAGNOSIS — O113 Pre-existing hypertension with pre-eclampsia, third trimester: Secondary | ICD-10-CM

## 2020-11-19 DIAGNOSIS — Z3A30 30 weeks gestation of pregnancy: Secondary | ICD-10-CM

## 2020-11-19 DIAGNOSIS — O4693 Antepartum hemorrhage, unspecified, third trimester: Secondary | ICD-10-CM

## 2020-11-19 DIAGNOSIS — O24414 Gestational diabetes mellitus in pregnancy, insulin controlled: Secondary | ICD-10-CM | POA: Diagnosis not present

## 2020-11-19 LAB — CBC
HCT: 33.9 % — ABNORMAL LOW (ref 36.0–46.0)
Hemoglobin: 11 g/dL — ABNORMAL LOW (ref 12.0–15.0)
MCH: 29.1 pg (ref 26.0–34.0)
MCHC: 32.4 g/dL (ref 30.0–36.0)
MCV: 89.7 fL (ref 80.0–100.0)
Platelets: 193 10*3/uL (ref 150–400)
RBC: 3.78 MIL/uL — ABNORMAL LOW (ref 3.87–5.11)
RDW: 15.1 % (ref 11.5–15.5)
WBC: 11.4 10*3/uL — ABNORMAL HIGH (ref 4.0–10.5)
nRBC: 0 % (ref 0.0–0.2)

## 2020-11-19 LAB — GLUCOSE, CAPILLARY
Glucose-Capillary: 80 mg/dL (ref 70–99)
Glucose-Capillary: 148 mg/dL — ABNORMAL HIGH (ref 70–99)
Glucose-Capillary: 132 mg/dL — ABNORMAL HIGH (ref 70–99)
Glucose-Capillary: 103 mg/dL — ABNORMAL HIGH (ref 70–99)

## 2020-11-19 LAB — CULTURE, OB URINE: Culture: <10000 — AB

## 2020-11-19 LAB — COMPREHENSIVE METABOLIC PANEL
ALT: 29 U/L (ref 0–44)
AST: 26 U/L (ref 15–41)
Albumin: 2.4 g/dL — ABNORMAL LOW (ref 3.5–5.0)
Alkaline Phosphatase: 96 U/L (ref 38–126)
Anion gap: 10 (ref 5–15)
BUN: 5 mg/dL — ABNORMAL LOW (ref 6–20)
CO2: 21 mmol/L — ABNORMAL LOW (ref 22–32)
Calcium: 7.2 mg/dL — ABNORMAL LOW (ref 8.9–10.3)
Chloride: 107 mmol/L (ref 98–111)
Creatinine, Ser: 0.63 mg/dL (ref 0.44–1.00)
GFR, Estimated: >60 mL/min (ref >60)
Glucose, Bld: 103 mg/dL — ABNORMAL HIGH (ref 70–99)
Potassium: 3.9 mmol/L (ref 3.5–5.1)
Sodium: 138 mmol/L (ref 135–145)
Total Bilirubin: 0.5 mg/dL (ref 0.3–1.2)
Total Protein: 6.2 g/dL — ABNORMAL LOW (ref 6.5–8.1)

## 2020-11-19 MED ORDER — INSULIN ASPART 100 UNIT/ML ~~LOC~~ SOLN
0.0000 [IU] | Freq: Three times a day (TID) | SUBCUTANEOUS | Status: DC
  Administered 2020-11-19: 3 [IU] via SUBCUTANEOUS

## 2020-11-19 MED ORDER — LABETALOL HCL 200 MG PO TABS
200.0000 mg | ORAL_TABLET | Freq: Two times a day (BID) | ORAL | Status: DC
  Administered 2020-11-19 – 2020-11-24 (×12): 200 mg via ORAL
  Filled 2020-11-19 (×12): qty 1

## 2020-11-19 NOTE — Progress Notes (Signed)
Genese shared the ups and downs of the last several days. She was tearful as she expressed her grief over the constant changing prognosis that also feels like it's worsening and her fear for her baby's health. Three years ago Jezebelle didn't imagine that motherhood would be part of her life, but is so excited to meet her son Alford [place]. Chaplain facilitated story telling and helped patient process her complicated emotions and normalized her grief in the loss of her expected pregnancy and her feelings of isolation here in the hospital. Pt is aware of how to reach daytime and night time chaplain.  Followed up later in the evening and pt reported feeling a little better after talking.  Please page as further needs arise.  Maryanna Shape. Carley Hammed, M.Div. [facility] Chaplain Pager [phone] Office [phone]

## 2020-11-19 NOTE — Telephone Encounter (Signed)
Patient called to inform Dr. Despina Hidden ,on Saturday 11/18/2020 she starting bleeding (went to [facility]) and blood pressure went up on Monday 11/17/2020 to 197/111. Patient is currently at [facility] and don't know how long she will be kept there, per patient. Patient also stated that Dr. Despina Hidden should be able to see all of information in her chart. Information was sent over to Provider.

## 2020-11-19 NOTE — Consult Note (Signed)
Neonatology Consult to Antenatal Patient: 11/19/2020 7:21 PM    I was requested by Dr Shawnie Pons to see this patient in order to provide antenatal counseling due to preeclampsia at 30 5/[redacted] weeks gestation.    Rebecca Frey is a 30 y/o Primigravida who was admitted yesterday and is now 30 5/[redacted] weeks GA.   She is currently "not" having active labor.  Pregnancy complicated by  GDM -diet controlled, chronic HTN with superimposed preeclampsia.   She received Rhogam on 1/21 and FOB has clubfeet.  Rebecca Frey had a course of BMZ on 2/8 and 2/9 and Magnesium Sulfate which was discontinued this morning. Presently on Labetalol.  I spoke with Rebecca Frey in Room 109.   We discussed in detail what to expect in case of possible delivery of the infant in the next few days including morbidity and mortality at this gestational age, usual delivery room resuscitation including intubation and surfactant administration in the DR.  Discussed possible respiratory complications and need for support including mechanical ventilation, IV access, sepsis work-up, NG/OG feedings ( benefits of MBM and availability of DBM and MOB desires breast feeding, which was encouraged), risk for IVH with the potential for motor/cognitive deficits, length of stay and long-term outcome.   She was attentive, had appropriate questions, and expressed appreciation for my input.  Thank you for asking [place] to see this patient and allowing [place] to participate in her care.  Please call if there are any further questions.   Overton Mam, MD (Attending Neonatologist)  Total length of face-to-face or floor/unit time for this encounter was 40 minutes. Counseling and/or coordination of care was greater than fifty percent of the time above.

## 2020-11-19 NOTE — Lactation Note (Signed)
Lactation Consultation Note  Patient Name: Rebecca Frey Date: 11/19/2020 Reason for consult: Other (Comment) (Antenatal consult) Age:30 y.o.  Antenatal visit to P0. Mother plans to breastfeed. Discuss breastfeeding benefits for mother and infant. Explain breastfeeding support while inpatient and outpatient. Talked about pumping, stimulation and providing EBM even if mother&infant are separated. Shared skin to skin positive effects on infants and family. Provided INJoy "understanding breastfeeding" booklet and reinforced checking available resources.  Mother is a [facility] participant at [facility] .   Encouraged mother to contact [facility] services for any question/concern/need.   Maternal Data Does the patient have breastfeeding experience prior to this delivery?: No  Feeding Mother's Current Feeding Choice: Breast Milk  Interventions Interventions: Breast feeding basics reviewed;Skin to skin;Expressed milk;Education  Discharge WIC Program: Yes  Consult Status Consult Status: Follow-up Follow-up type: In-patient    Soha Thorup A Higuera Ancidey 11/19/2020, 7:38 PM

## 2020-11-19 NOTE — Progress Notes (Signed)
Pt just back from bathroom and states she is feeling anxious about being away from her husband. Pt tearful. Will reassess BP.

## 2020-11-19 NOTE — Progress Notes (Signed)
Patient ID: Rebecca Frey, female   DOB: [birth year], 30 y.o.   MRN: [id number] FACULTY PRACTICE ANTEPARTUM(COMPREHENSIVE) NOTE  Rebecca Frey is a 30 y.o. G1P0 at [redacted]w[redacted]d by best clinical estimate who is admitted for preeclampsia with severe features.   Fetal presentation is cephalic. Length of Stay:  1  Days  ASSESSMENT: Active Problems:   Chronic hypertension with superimposed preeclampsia   Vaginal bleeding in pregnancy, third trimester   PLAN: Continue inpatient monitoring. Status post betamethasone x2 Have discontinued her magnesium today. Add labetalol 200 twice daily for blood pressure control. Have advised ongoing hospitalization due to recent vaginal bleeding--> was admitted to [facility] with vaginal bleeding over the weekend has continued to have spotting and even had some overnight last night.  Subjective: Continues to have some spotting. She denies headache, vision changes Patient reports the fetal movement as active. Patient reports uterine contraction  activity as none. Patient reports  vaginal bleeding as scant staining. Patient describes fluid per vagina as None.  Vitals:  Blood pressure (!) 142/70, pulse 75, temperature 98 F (36.7 C), temperature source Oral, resp. rate 18, height 5\' 4"  (1.626 m), weight 121.6 kg, last menstrual period 03/19/2020, SpO2 100 %. Physical Examination:  General appearance - alert, well appearing, and in no distress Chest - normal effort Abdomen - gravid, non-tender Fundal Height:  size equals dates Extremities: Homans sign is negative, no sign of DVT  Membranes:intact  Fetal Monitoring:  Baseline: 115 bpm, Variability: Good {> 6 bpm), Accelerations: Reactive and Decelerations: Absent  Labs:  Results for orders placed or performed during the hospital encounter of 11/18/20 (from the past 24 hour(s))  OB Urine Culture   Collection Time: 11/18/20  4:20 PM   Specimen: Urine, Random  Result Value Ref Range   Specimen Description  URINE, RANDOM    Special Requests NONE    Culture (A)     <10,000 COLONIES/mL INSIGNIFICANT GROWTH NO GROUP B STREP (S.AGALACTIAE) ISOLATED Performed at [facility], [address] [place], [place]    Report Status 11/19/2020 FINAL   Glucose, capillary   Collection Time: 11/18/20  6:38 PM  Result Value Ref Range   Glucose-Capillary 123 (H) 70 - 99 mg/dL  Glucose, capillary   Collection Time: 11/18/20 10:21 PM  Result Value Ref Range   Glucose-Capillary 145 (H) 70 - 99 mg/dL  Comprehensive metabolic panel   Collection Time: 11/19/20  4:54 AM  Result Value Ref Range   Sodium 138 135 - 145 mmol/L   Potassium 3.9 3.5 - 5.1 mmol/L   Chloride 107 98 - 111 mmol/L   CO2 21 (L) 22 - 32 mmol/L   Glucose, Bld 103 (H) 70 - 99 mg/dL   BUN 5 (L) 6 - 20 mg/dL   Creatinine, Ser 01/17/21 0.44 - 1.00 mg/dL   Calcium 7.2 (L) 8.9 - 10.3 mg/dL   Total Protein 6.2 (L) 6.5 - 8.1 g/dL   Albumin 2.4 (L) 3.5 - 5.0 g/dL   AST 26 15 - 41 U/L   ALT 29 0 - 44 U/L   Alkaline Phosphatase 96 38 - 126 U/L   Total Bilirubin 0.5 0.3 - 1.2 mg/dL   GFR, Estimated 1.91 >47 mL/min   Anion gap 10 5 - 15  CBC on admission   Collection Time: 11/19/20  4:54 AM  Result Value Ref Range   WBC 11.4 (H) 4.0 - 10.5 K/uL   RBC 3.78 (L) 3.87 - 5.11 MIL/uL   Hemoglobin 11.0 (L)  12.0 - 15.0 g/dL   HCT 40.1 (L) 02.7 - 25.3 %   MCV 89.7 80.0 - 100.0 fL   MCH 29.1 26.0 - 34.0 pg   MCHC 32.4 30.0 - 36.0 g/dL   RDW 66.4 40.3 - 47.4 %   Platelets 193 150 - 400 K/uL   nRBC 0.0 0.0 - 0.2 %  Glucose, capillary   Collection Time: 11/19/20  9:07 AM  Result Value Ref Range   Glucose-Capillary 80 70 - 99 mg/dL  Glucose, capillary   Collection Time: 11/19/20 11:17 AM  Result Value Ref Range   Glucose-Capillary 148 (H) 70 - 99 mg/dL     Medications:  Scheduled . docusate sodium  100 mg Oral Daily  . insulin aspart  0-20 Units Subcutaneous TID WC  . labetalol  200 mg Oral BID  . prenatal multivitamin  1 tablet  Oral Q1200   I have reviewed the patient's current medications.   Rebecca Bores, MD 11/19/2020,3:35 PM

## 2020-11-20 DIAGNOSIS — O24414 Gestational diabetes mellitus in pregnancy, insulin controlled: Secondary | ICD-10-CM

## 2020-11-20 DIAGNOSIS — O113 Pre-existing hypertension with pre-eclampsia, third trimester: Secondary | ICD-10-CM | POA: Diagnosis not present

## 2020-11-20 DIAGNOSIS — O4693 Antepartum hemorrhage, unspecified, third trimester: Secondary | ICD-10-CM | POA: Diagnosis not present

## 2020-11-20 DIAGNOSIS — Z3A30 30 weeks gestation of pregnancy: Secondary | ICD-10-CM | POA: Diagnosis not present

## 2020-11-20 LAB — GLUCOSE, CAPILLARY
Glucose-Capillary: 63 mg/dL — ABNORMAL LOW (ref 70–99)
Glucose-Capillary: 92 mg/dL (ref 70–99)
Glucose-Capillary: 75 mg/dL (ref 70–99)
Glucose-Capillary: 98 mg/dL (ref 70–99)

## 2020-11-20 NOTE — Plan of Care (Signed)
  Problem: Coping: Goal: Level of anxiety will decrease Outcome: Progressing   Problem: Education: Goal: Knowledge of General Education information will improve Description: Including pain rating scale, medication(s)/side effects and non-pharmacologic comfort measures Outcome: Completed/Met   Problem: Activity: Goal: Risk for activity intolerance will decrease Outcome: Completed/Met   Problem: Nutrition: Goal: Adequate nutrition will be maintained Outcome: Completed/Met   Problem: Elimination: Goal: Will not experience complications related to urinary retention Outcome: Completed/Met   Problem: Education: Goal: Knowledge of disease or condition will improve Outcome: Completed/Met Goal: Knowledge of the prescribed therapeutic regimen will improve Outcome: Completed/Met   Problem: Education: Goal: Knowledge of disease or condition will improve Outcome: Completed/Met

## 2020-11-20 NOTE — Progress Notes (Signed)
Patient ID: Aquinnah Devin, female   DOB: March 24, 1991, 30 y.o.   MRN: [id number] FACULTY PRACTICE ANTEPARTUM(COMPREHENSIVE) NOTE  Kaylina Cahue is a 30 y.o. G1P0 at [redacted]w[redacted]d by best clinical estimate who is admitted for preeclampsia.   Fetal presentation is cephalic. Length of Stay:  2  Days  ASSESSMENT: Principal Problem:   Vaginal bleeding in pregnancy, third trimester Active Problems:   Rh negative state in antepartum period   Gestational diabetes   Chronic hypertension with superimposed preeclampsia   PLAN: Continue inpatient management Labetalol 200 mg 2 times daily which is controlling her blood pressures well We will repeat labs tomorrow Ultrasound for growth next week Status post betamethasone x2 No further bleeding today. Patient has sliding-scale insulin as needed for elevated blood sugars in her a.m. sugar today was 63.  Subjective: Feels well today.  Denies headache, vision changes, right upper quadrant pain. Patient reports the fetal movement as active. Patient reports uterine contraction  activity as none. Patient reports  vaginal bleeding as scant staining. Patient describes fluid per vagina as None.  Vitals:  Blood pressure 124/60, pulse 73, temperature 98.2 F (36.8 C), temperature source Oral, resp. rate 18, height 5\' 4"  (1.626 m), weight 121.6 kg, last menstrual period 03/19/2020, SpO2 99 %. Physical Examination:  General appearance - alert, well appearing, and in no distress Chest - normal effort Abdomen - gravid, non-tender Fundal Height:  size equals dates Extremities: Homans sign is negative, no sign of DVT  Membranes:intact  Fetal Monitoring:  Baseline: 125 bpm, Variability: Good {> 6 bpm), Accelerations: Reactive and Decelerations: Absent  Labs:  Results for orders placed or performed during the hospital encounter of 11/18/20 (from the past 24 hour(s))  Glucose, capillary   Collection Time: 11/19/20  4:10 PM  Result Value Ref Range    Glucose-Capillary 132 (H) 70 - 99 mg/dL  Glucose, capillary   Collection Time: 11/19/20  7:49 PM  Result Value Ref Range   Glucose-Capillary 103 (H) 70 - 99 mg/dL  Glucose, capillary   Collection Time: 11/20/20  6:18 AM  Result Value Ref Range   Glucose-Capillary 63 (L) 70 - 99 mg/dL  Glucose, capillary   Collection Time: 11/20/20 12:29 PM  Result Value Ref Range   Glucose-Capillary 92 70 - 99 mg/dL    Medications:  Scheduled . docusate sodium  100 mg Oral Daily  . insulin aspart  0-20 Units Subcutaneous TID PC  . labetalol  200 mg Oral BID  . prenatal multivitamin  1 tablet Oral Q1200   I have reviewed the patient's current medications.   01/18/21, MD 11/20/2020,12:54 PM

## 2020-11-21 ENCOUNTER — Inpatient Hospital Stay (HOSPITAL_BASED_OUTPATIENT_CLINIC_OR_DEPARTMENT_OTHER): Payer: Medicaid Other

## 2020-11-21 ENCOUNTER — Other Ambulatory Visit: Payer: Medicaid Other

## 2020-11-21 ENCOUNTER — Encounter: Payer: Medicaid Other | Admitting: Medical

## 2020-11-21 DIAGNOSIS — O4693 Antepartum hemorrhage, unspecified, third trimester: Secondary | ICD-10-CM | POA: Diagnosis not present

## 2020-11-21 DIAGNOSIS — O360130 Maternal care for anti-D [Rh] antibodies, third trimester, not applicable or unspecified: Secondary | ICD-10-CM | POA: Diagnosis not present

## 2020-11-21 DIAGNOSIS — O113 Pre-existing hypertension with pre-eclampsia, third trimester: Secondary | ICD-10-CM | POA: Diagnosis not present

## 2020-11-21 DIAGNOSIS — O24410 Gestational diabetes mellitus in pregnancy, diet controlled: Secondary | ICD-10-CM | POA: Diagnosis not present

## 2020-11-21 DIAGNOSIS — O365930 Maternal care for other known or suspected poor fetal growth, third trimester, not applicable or unspecified: Secondary | ICD-10-CM

## 2020-11-21 DIAGNOSIS — Z3A31 31 weeks gestation of pregnancy: Secondary | ICD-10-CM

## 2020-11-21 DIAGNOSIS — O24414 Gestational diabetes mellitus in pregnancy, insulin controlled: Secondary | ICD-10-CM | POA: Diagnosis not present

## 2020-11-21 DIAGNOSIS — Z3A30 30 weeks gestation of pregnancy: Secondary | ICD-10-CM | POA: Diagnosis not present

## 2020-11-21 LAB — CBC
HCT: 33.6 % — ABNORMAL LOW (ref 36.0–46.0)
Hemoglobin: 11.2 g/dL — ABNORMAL LOW (ref 12.0–15.0)
MCH: 30.1 pg (ref 26.0–34.0)
MCHC: 33.3 g/dL (ref 30.0–36.0)
MCV: 90.3 fL (ref 80.0–100.0)
Platelets: 222 10*3/uL (ref 150–400)
RBC: 3.72 MIL/uL — ABNORMAL LOW (ref 3.87–5.11)
RDW: 15.4 % (ref 11.5–15.5)
WBC: 12.5 10*3/uL — ABNORMAL HIGH (ref 4.0–10.5)
nRBC: 0 % (ref 0.0–0.2)

## 2020-11-21 LAB — COMPREHENSIVE METABOLIC PANEL
ALT: 38 U/L (ref 0–44)
AST: 29 U/L (ref 15–41)
Albumin: 2.6 g/dL — ABNORMAL LOW (ref 3.5–5.0)
Alkaline Phosphatase: 101 U/L (ref 38–126)
Anion gap: 10 (ref 5–15)
BUN: 10 mg/dL (ref 6–20)
CO2: 22 mmol/L (ref 22–32)
Calcium: 8.9 mg/dL (ref 8.9–10.3)
Chloride: 106 mmol/L (ref 98–111)
Creatinine, Ser: 0.78 mg/dL (ref 0.44–1.00)
GFR, Estimated: >60 mL/min (ref >60)
Glucose, Bld: 74 mg/dL (ref 70–99)
Potassium: 4.8 mmol/L (ref 3.5–5.1)
Sodium: 138 mmol/L (ref 135–145)
Total Bilirubin: 0.5 mg/dL (ref 0.3–1.2)
Total Protein: 6.6 g/dL (ref 6.5–8.1)

## 2020-11-21 LAB — TYPE AND SCREEN
ABO/RH(D): A NEG
Antibody Screen: POSITIVE

## 2020-11-21 LAB — GLUCOSE, CAPILLARY
Glucose-Capillary: 67 mg/dL — ABNORMAL LOW (ref 70–99)
Glucose-Capillary: 65 mg/dL — ABNORMAL LOW (ref 70–99)
Glucose-Capillary: 83 mg/dL (ref 70–99)
Glucose-Capillary: 109 mg/dL — ABNORMAL HIGH (ref 70–99)

## 2020-11-21 NOTE — Progress Notes (Signed)
This is a late entry note for a visit that took place on 2/10.  Rebecca Frey reported that she is doing better today.  She was grateful for family support and is looking forward to having her husband here tonight and other family members over the weekend.  She was so appreciative of the prayer shawl and the time with Elenor Quinones and all of the caregivers that she has had here at the hospital.  Kathleen Argue, Bcc Pager, [phone] 4:43 PM

## 2020-11-21 NOTE — Progress Notes (Signed)
Patient ID: Rebecca Frey, female   DOB: May 01, 1991, 30 y.o.   MRN: [id number] FACULTY PRACTICE ANTEPARTUM(COMPREHENSIVE) NOTE  Rebecca Frey is a 30 y.o. G1P0 at [redacted]w[redacted]d by best clinical estimate who is admitted for preeclampsia.   Fetal presentation is cephalic. Length of Stay:  3  Days  ASSESSMENT: Principal Problem:   Vaginal bleeding in pregnancy, third trimester Active Problems:   Rh negative state in antepartum period   Gestational diabetes   Chronic hypertension with superimposed preeclampsia   PLAN: Continue inpatient management Labetalol 200 mg twice daily BP control Repeat labs today Ultrasound for growth today Status post betamethasone x2 No further bleeding today Sliding scale insulin if needed however her CBGs seem to be normalizing post betamethasone  Subjective: Feels well today, denies headache, right upper quadrant pain, vision changes Patient reports the fetal movement as active. Patient reports uterine contraction  activity as none. Patient reports  vaginal bleeding as none. Patient describes fluid per vagina as None.  Vitals:  Blood pressure 134/74, pulse 63, temperature 98.1 F (36.7 C), temperature source Oral, resp. rate 18, height 5\' 4"  (1.626 m), weight 121.6 kg, last menstrual period 03/19/2020, SpO2 100 %. Physical Examination:  General appearance - alert, well appearing, and in no distress Chest - normal effort Abdomen - gravid, non-tender Fundal Height:  size equals dates Extremities: Homans sign is negative, no sign of DVT  Membranes: Dr. 05/19/2020  Fetal Monitoring:  Baseline: 125 bpm, Variability: Good {> 6 bpm), Accelerations: Reactive and Decelerations: Absent  Labs:  Results for orders placed or performed during the hospital encounter of 11/18/20 (from the past 24 hour(s))  Glucose, capillary   Collection Time: 11/20/20 12:29 PM  Result Value Ref Range   Glucose-Capillary 92 70 - 99 mg/dL  Glucose, capillary   Collection Time: 11/20/20   4:37 PM  Result Value Ref Range   Glucose-Capillary 75 70 - 99 mg/dL  Glucose, capillary   Collection Time: 11/20/20  8:19 PM  Result Value Ref Range   Glucose-Capillary 98 70 - 99 mg/dL  Type and screen [facility]   Collection Time: 11/21/20  6:22 AM  Result Value Ref Range   ABO/RH(D) A NEG    Antibody Screen POS    Sample Expiration 11/24/2020,2359    Antibody Identification      PASSIVELY ACQUIRED ANTI-D Performed at [facility], [address], [place], [place]   Glucose, capillary   Collection Time: 11/21/20  6:28 AM  Result Value Ref Range   Glucose-Capillary 67 (L) 70 - 99 mg/dL  CBC   Collection Time: 11/21/20  9:09 AM  Result Value Ref Range   WBC 12.5 (H) 4.0 - 10.5 K/uL   RBC 3.72 (L) 3.87 - 5.11 MIL/uL   Hemoglobin 11.2 (L) 12.0 - 15.0 g/dL   HCT 01/19/21 (L) 16.9 - 67.8 %   MCV 90.3 80.0 - 100.0 fL   MCH 30.1 26.0 - 34.0 pg   MCHC 33.3 30.0 - 36.0 g/dL   RDW 93.8 10.1 - 75.1 %   Platelets 222 150 - 400 K/uL   nRBC 0.0 0.0 - 0.2 %    Medications:  Scheduled . docusate sodium  100 mg Oral Daily  . insulin aspart  0-20 Units Subcutaneous TID PC  . labetalol  200 mg Oral BID  . prenatal multivitamin  1 tablet Oral Q1200   I have reviewed the patient's current medications.   02.5, MD 11/21/2020,9:54 AM

## 2020-11-22 DIAGNOSIS — Z3A31 31 weeks gestation of pregnancy: Secondary | ICD-10-CM | POA: Diagnosis not present

## 2020-11-22 DIAGNOSIS — O365930 Maternal care for other known or suspected poor fetal growth, third trimester, not applicable or unspecified: Secondary | ICD-10-CM

## 2020-11-22 DIAGNOSIS — O24410 Gestational diabetes mellitus in pregnancy, diet controlled: Secondary | ICD-10-CM

## 2020-11-22 DIAGNOSIS — O322XX0 Maternal care for transverse and oblique lie, not applicable or unspecified: Secondary | ICD-10-CM

## 2020-11-22 DIAGNOSIS — O4693 Antepartum hemorrhage, unspecified, third trimester: Secondary | ICD-10-CM | POA: Diagnosis not present

## 2020-11-22 DIAGNOSIS — O113 Pre-existing hypertension with pre-eclampsia, third trimester: Secondary | ICD-10-CM | POA: Diagnosis not present

## 2020-11-22 LAB — GLUCOSE, CAPILLARY
Glucose-Capillary: 65 mg/dL — ABNORMAL LOW (ref 70–99)
Glucose-Capillary: 70 mg/dL (ref 70–99)
Glucose-Capillary: 71 mg/dL (ref 70–99)
Glucose-Capillary: 104 mg/dL — ABNORMAL HIGH (ref 70–99)
Glucose-Capillary: 82 mg/dL (ref 70–99)

## 2020-11-22 NOTE — Progress Notes (Signed)
FACULTY PRACTICE ANTEPARTUM PROGRESS NOTE  Rebecca Frey is a 30 y.o. G1P0 at [redacted]w[redacted]d who is admitted for chronic hypertension with superimposed preeclampsia.  Estimated Date of Delivery: 01/23/21 Fetal presentation is transverse, head materal right.  Length of Stay:  4 Days. Admitted 11/18/2020  Subjective: Pt is doing well, no acute complaints today.She denies headache currently.  Discussed new IUGR findings with fetal growth at 4%.   Patient reports normal fetal movement.  She denies uterine contractions, and leaking of fluid per vagina.  Pt denies any further vaginal bleeding since 11/19/20.  Vitals:  Blood pressure (!) 130/59, pulse 70, temperature 98.2 F (36.8 C), temperature source Oral, resp. rate 18, height 5\' 4"  (1.626 m), weight 121.6 kg, last menstrual period 03/19/2020, SpO2 99 %. Physical Examination: CONSTITUTIONAL: Well-developed, well-nourished female in no acute distress.  HENT:  Normocephalic, atraumatic, External right and left ear normal. Oropharynx is clear and moist EYES: Conjunctivae and EOM are normal.  NECK: Normal range of motion, supple, no masses. SKIN: Skin is warm and dry. No rash noted. Not diaphoretic. No erythema. No pallor. NEUROLGIC: Alert and oriented to person, place, and time. Normal reflexes, muscle tone coordination. No cranial nerve deficit noted. PSYCHIATRIC: Normal mood and affect. Normal behavior. Normal judgment and thought content. CARDIOVASCULAR: Normal heart rate noted, regular rhythm RESPIRATORY: Effort and breath sounds normal, no problems with respiration noted MUSCULOSKELETAL: Normal range of motion. No edema and no tenderness. ABDOMEN: Soft, nontender, nondistended, gravid. CERVIX: deferred  Fetal monitoring: FHR: 130 bpm, Variability: moderate, Accelerations: Present, Decelerations: Absent , category 1 strip, reactive Uterine activity: irritability   Results for orders placed or performed during the hospital encounter of 11/18/20 (from  the past 48 hour(s))  Glucose, capillary     Status: None   Collection Time: 11/20/20 12:29 PM  Result Value Ref Range   Glucose-Capillary 92 70 - 99 mg/dL    Comment: Glucose reference range applies only to samples taken after fasting for at least 8 hours.  Glucose, capillary     Status: None   Collection Time: 11/20/20  4:37 PM  Result Value Ref Range   Glucose-Capillary 75 70 - 99 mg/dL    Comment: Glucose reference range applies only to samples taken after fasting for at least 8 hours.  Glucose, capillary     Status: None   Collection Time: 11/20/20  8:19 PM  Result Value Ref Range   Glucose-Capillary 98 70 - 99 mg/dL    Comment: Glucose reference range applies only to samples taken after fasting for at least 8 hours.  Type and screen [facility]     Status: None   Collection Time: 11/21/20  6:22 AM  Result Value Ref Range   ABO/RH(D) A NEG    Antibody Screen POS    Sample Expiration 11/24/2020,2359    Antibody Identification      PASSIVELY ACQUIRED ANTI-D Performed at [facility], [address] [place], [place]   Glucose, capillary     Status: Abnormal   Collection Time: 11/21/20  6:28 AM  Result Value Ref Range   Glucose-Capillary 67 (L) 70 - 99 mg/dL    Comment: Glucose reference range applies only to samples taken after fasting for at least 8 hours.  CBC     Status: Abnormal   Collection Time: 11/21/20  9:09 AM  Result Value Ref Range   WBC 12.5 (H) 4.0 - 10.5 K/uL   RBC 3.72 (L) 3.87 - 5.11 MIL/uL  Hemoglobin 11.2 (L) 12.0 - 15.0 g/dL   HCT 34.1 (L) 93.7 - 90.2 %   MCV 90.3 80.0 - 100.0 fL   MCH 30.1 26.0 - 34.0 pg   MCHC 33.3 30.0 - 36.0 g/dL   RDW 40.9 73.5 - 32.9 %   Platelets 222 150 - 400 K/uL   nRBC 0.0 0.0 - 0.2 %    Comment: Performed at [facility], [address] [place], [place] [address]  Comprehensive metabolic panel     Status: Abnormal   Collection Time: 11/21/20  9:09 AM  Result Value Ref Range    Sodium 138 135 - 145 mmol/L   Potassium 4.8 3.5 - 5.1 mmol/L   Chloride 106 98 - 111 mmol/L   CO2 22 22 - 32 mmol/L   Glucose, Bld 74 70 - 99 mg/dL    Comment: Glucose reference range applies only to samples taken after fasting for at least 8 hours.   BUN 10 6 - 20 mg/dL   Creatinine, Ser 8.34 0.44 - 1.00 mg/dL   Calcium 8.9 8.9 - 19.6 mg/dL   Total Protein 6.6 6.5 - 8.1 g/dL   Albumin 2.6 (L) 3.5 - 5.0 g/dL   AST 29 15 - 41 U/L   ALT 38 0 - 44 U/L   Alkaline Phosphatase 101 38 - 126 U/L   Total Bilirubin 0.5 0.3 - 1.2 mg/dL   GFR, Estimated >22 >29 mL/min    Comment: (NOTE) Calculated using the CKD-EPI Creatinine Equation (2021)    Anion gap 10 5 - 15    Comment: Performed at [facility], [address] [place], [place] [address]  Glucose, capillary     Status: Abnormal   Collection Time: 11/21/20 11:41 AM  Result Value Ref Range   Glucose-Capillary 65 (L) 70 - 99 mg/dL    Comment: Glucose reference range applies only to samples taken after fasting for at least 8 hours.  Glucose, capillary     Status: None   Collection Time: 11/21/20  3:38 PM  Result Value Ref Range   Glucose-Capillary 83 70 - 99 mg/dL    Comment: Glucose reference range applies only to samples taken after fasting for at least 8 hours.  Glucose, capillary     Status: Abnormal   Collection Time: 11/21/20  8:13 PM  Result Value Ref Range   Glucose-Capillary 109 (H) 70 - 99 mg/dL    Comment: Glucose reference range applies only to samples taken after fasting for at least 8 hours.  Glucose, capillary     Status: Abnormal   Collection Time: 11/22/20  7:16 AM  Result Value Ref Range   Glucose-Capillary 65 (L) 70 - 99 mg/dL    Comment: Glucose reference range applies only to samples taken after fasting for at least 8 hours.  Glucose, capillary     Status: None   Collection Time: 11/22/20  7:40 AM  Result Value Ref Range   Glucose-Capillary 70 70 - 99 mg/dL    Comment: Glucose reference range applies  only to samples taken after fasting for at least 8 hours.  Glucose, capillary     Status: None   Collection Time: 11/22/20 11:24 AM  Result Value Ref Range   Glucose-Capillary 71 70 - 99 mg/dL    Comment: Glucose reference range applies only to samples taken after fasting for at least 8 hours.    I have reviewed the patient's current medications.  ASSESSMENT: Principal Problem:   Vaginal  bleeding in pregnancy, third trimester Active Problems:   Rh negative state in antepartum period   Gestational diabetes   Chronic hypertension with superimposed preeclampsia   PLAN: Continue inpatient monitoring at this time Probable 5-7 days stay s/p third trimester bleeding Blood sugars well controlled. Pt aware of IUGR status, fetus will need weekly testing and dopplers. BP well controlled with labetalol.   Continue routine antenatal care.   Mariel Aloe, MD [facility] Faculty Attending, Center for [facility] 11/22/2020 11:57 AM

## 2020-11-22 NOTE — Progress Notes (Signed)
Hypoglycemic Event  CBG: 65 at 0716  Treatment: 4 oz juice/soda  Symptoms: lightheaded  Follow-up CBG: Time: 0740 CBG Result: 70  Possible Reasons for Event: Unknown  Comments/MD notified: followed hypoglycemia protocol    [name] [name]

## 2020-11-23 DIAGNOSIS — O4693 Antepartum hemorrhage, unspecified, third trimester: Secondary | ICD-10-CM | POA: Diagnosis not present

## 2020-11-23 DIAGNOSIS — O24410 Gestational diabetes mellitus in pregnancy, diet controlled: Secondary | ICD-10-CM | POA: Diagnosis not present

## 2020-11-23 DIAGNOSIS — O113 Pre-existing hypertension with pre-eclampsia, third trimester: Secondary | ICD-10-CM | POA: Diagnosis not present

## 2020-11-23 DIAGNOSIS — O365930 Maternal care for other known or suspected poor fetal growth, third trimester, not applicable or unspecified: Secondary | ICD-10-CM | POA: Diagnosis not present

## 2020-11-23 DIAGNOSIS — Z3A31 31 weeks gestation of pregnancy: Secondary | ICD-10-CM | POA: Diagnosis not present

## 2020-11-23 DIAGNOSIS — O322XX0 Maternal care for transverse and oblique lie, not applicable or unspecified: Secondary | ICD-10-CM | POA: Diagnosis not present

## 2020-11-23 LAB — GLUCOSE, CAPILLARY
Glucose-Capillary: 63 mg/dL — ABNORMAL LOW (ref 70–99)
Glucose-Capillary: 84 mg/dL (ref 70–99)
Glucose-Capillary: 73 mg/dL (ref 70–99)
Glucose-Capillary: 104 mg/dL — ABNORMAL HIGH (ref 70–99)
Glucose-Capillary: 88 mg/dL (ref 70–99)

## 2020-11-23 NOTE — Progress Notes (Signed)
Hypoglycemic Event  CBG: 63 @0517   Treatment: 4 oz juice, crackers and peanut butter @0530   Symptoms: None noted  Follow-up CBG: Time:0546  CBG Result: 84  Possible Reasons for Event: Unknown. Patient ate bedtime snack around 2030 then fasted till morning CBG.     Natale Barba R Deepti Gunawan

## 2020-11-23 NOTE — Progress Notes (Signed)
FACULTY PRACTICE ANTEPARTUM PROGRESS NOTE  Rebecca Frey is a 30 y.o. G1P0 at [redacted]w[redacted]d who is admitted for chronic hypertension with superimposed preeclampsia..  Estimated Date of Delivery: 01/23/21 Fetal presentation is transverse.  Pt also now has IUGR with elevated S/D ratio.  Length of Stay:  5 Days. Admitted 11/18/2020  Subjective: Pt resting quietly.  She denies headache. Visual changes or RUQ pain. Patient reports normal fetal movement.  She denies uterine contractions, denies bleeding and leaking of fluid per vagina.  Vitals:  Blood pressure (!) 146/71, pulse 67, temperature 98.2 F (36.8 C), temperature source Oral, resp. rate 18, height 5\' 4"  (1.626 m), weight 121.6 kg, last menstrual period 03/19/2020, SpO2 97 %. Physical Examination: CONSTITUTIONAL: Well-developed, well-nourished female in no acute distress.  HENT:  Normocephalic, atraumatic, External right and left ear normal. Oropharynx is clear and moist EYES: Conjunctivae and EOM are normal. NECK: Normal range of motion, supple, no masses. SKIN: Skin is warm and dry. No rash noted. Not diaphoretic. No erythema. No pallor. NEUROLGIC: Alert and oriented to person, place, and time. Normal reflexes, muscle tone coordination. No cranial nerve deficit noted. PSYCHIATRIC: Normal mood and affect. Normal behavior. Normal judgment and thought content. CARDIOVASCULAR: Normal heart rate noted, regular rhythm RESPIRATORY: Effort and breath sounds normal, no problems with respiration noted MUSCULOSKELETAL: Normal range of motion. No edema and no tenderness. ABDOMEN: Soft, nontender, nondistended, gravid. CERVIX: deferred  Fetal monitoring: FHR: 120s bpm, Variability: moderate, Accelerations: Present, Decelerations: Absent  Uterine activity: none  Results for orders placed or performed during the hospital encounter of 11/18/20 (from the past 48 hour(s))  CBC     Status: Abnormal   Collection Time: 11/21/20  9:09 AM  Result Value Ref  Range   WBC 12.5 (H) 4.0 - 10.5 K/uL   RBC 3.72 (L) 3.87 - 5.11 MIL/uL   Hemoglobin 11.2 (L) 12.0 - 15.0 g/dL   HCT 01/19/21 (L) 97.6 - 73.4 %   MCV 90.3 80.0 - 100.0 fL   MCH 30.1 26.0 - 34.0 pg   MCHC 33.3 30.0 - 36.0 g/dL   RDW 19.3 79.0 - 24.0 %   Platelets 222 150 - 400 K/uL   nRBC 0.0 0.0 - 0.2 %    Comment: Performed at [facility], [address], [place], [place]  Comprehensive metabolic panel     Status: Abnormal   Collection Time: 11/21/20  9:09 AM  Result Value Ref Range   Sodium 138 135 - 145 mmol/L   Potassium 4.8 3.5 - 5.1 mmol/L   Chloride 106 98 - 111 mmol/L   CO2 22 22 - 32 mmol/L   Glucose, Bld 74 70 - 99 mg/dL    Comment: Glucose reference range applies only to samples taken after fasting for at least 8 hours.   BUN 10 6 - 20 mg/dL   Creatinine, Ser 01/19/21 0.44 - 1.00 mg/dL   Calcium 8.9 8.9 - 2.42 mg/dL   Total Protein 6.6 6.5 - 8.1 g/dL   Albumin 2.6 (L) 3.5 - 5.0 g/dL   AST 29 15 - 41 U/L   ALT 38 0 - 44 U/L   Alkaline Phosphatase 101 38 - 126 U/L   Total Bilirubin 0.5 0.3 - 1.2 mg/dL   GFR, Estimated 68.3 >41 mL/min    Comment: (NOTE) Calculated using the CKD-EPI Creatinine Equation (2021)    Anion gap 10 5 - 15    Comment: Performed at [facility], [address]  [place], [place] [address]  Glucose, capillary     Status: Abnormal   Collection Time: 11/21/20 11:41 AM  Result Value Ref Range   Glucose-Capillary 65 (L) 70 - 99 mg/dL    Comment: Glucose reference range applies only to samples taken after fasting for at least 8 hours.  Glucose, capillary     Status: None   Collection Time: 11/21/20  3:38 PM  Result Value Ref Range   Glucose-Capillary 83 70 - 99 mg/dL    Comment: Glucose reference range applies only to samples taken after fasting for at least 8 hours.  Glucose, capillary     Status: Abnormal   Collection Time: 11/21/20  8:13 PM  Result Value Ref Range   Glucose-Capillary 109 (H) 70 - 99 mg/dL    Comment: Glucose  reference range applies only to samples taken after fasting for at least 8 hours.  Glucose, capillary     Status: Abnormal   Collection Time: 11/22/20  7:16 AM  Result Value Ref Range   Glucose-Capillary 65 (L) 70 - 99 mg/dL    Comment: Glucose reference range applies only to samples taken after fasting for at least 8 hours.  Glucose, capillary     Status: None   Collection Time: 11/22/20  7:40 AM  Result Value Ref Range   Glucose-Capillary 70 70 - 99 mg/dL    Comment: Glucose reference range applies only to samples taken after fasting for at least 8 hours.  Glucose, capillary     Status: None   Collection Time: 11/22/20 11:24 AM  Result Value Ref Range   Glucose-Capillary 71 70 - 99 mg/dL    Comment: Glucose reference range applies only to samples taken after fasting for at least 8 hours.  Glucose, capillary     Status: Abnormal   Collection Time: 11/22/20  2:47 PM  Result Value Ref Range   Glucose-Capillary 104 (H) 70 - 99 mg/dL    Comment: Glucose reference range applies only to samples taken after fasting for at least 8 hours.  Glucose, capillary     Status: None   Collection Time: 11/22/20  8:29 PM  Result Value Ref Range   Glucose-Capillary 82 70 - 99 mg/dL    Comment: Glucose reference range applies only to samples taken after fasting for at least 8 hours.  Glucose, capillary     Status: Abnormal   Collection Time: 11/23/20  5:17 AM  Result Value Ref Range   Glucose-Capillary 63 (L) 70 - 99 mg/dL    Comment: Glucose reference range applies only to samples taken after fasting for at least 8 hours.  Glucose, capillary     Status: None   Collection Time: 11/23/20  5:47 AM  Result Value Ref Range   Glucose-Capillary 84 70 - 99 mg/dL    Comment: Glucose reference range applies only to samples taken after fasting for at least 8 hours.    I have reviewed the patient's current medications.  ASSESSMENT: Principal Problem:   Vaginal bleeding in pregnancy, third  trimester Active Problems:   Rh negative state in antepartum period   Gestational diabetes   Chronic hypertension with superimposed preeclampsia   PLAN: Continue inpatient stay until Tuesday or Wednesday due to vaginal bleeding Blood pressures remain in mild range, continue labetalol at 200 mg BID, mo increase as to not mask worsening pressures.  No s/sx of worsening preeclampsia Good blood sugar control, monitor for hypoglycemia. Weekly BPP and dopplers   Continue routine antenatal care.  Mariel Aloe, MD [facility] Faculty Attending, Center for [facility] 11/23/2020 7:24 AM

## 2020-11-24 ENCOUNTER — Encounter (HOSPITAL_COMMUNITY): Payer: Self-pay | Admitting: Obstetrics & Gynecology

## 2020-11-24 DIAGNOSIS — O1413 Severe pre-eclampsia, third trimester: Secondary | ICD-10-CM

## 2020-11-24 DIAGNOSIS — O24410 Gestational diabetes mellitus in pregnancy, diet controlled: Secondary | ICD-10-CM | POA: Diagnosis not present

## 2020-11-24 DIAGNOSIS — O10013 Pre-existing essential hypertension complicating pregnancy, third trimester: Secondary | ICD-10-CM

## 2020-11-24 DIAGNOSIS — Z3A31 31 weeks gestation of pregnancy: Secondary | ICD-10-CM | POA: Diagnosis not present

## 2020-11-24 LAB — GLUCOSE, CAPILLARY
Glucose-Capillary: 65 mg/dL — ABNORMAL LOW (ref 70–99)
Glucose-Capillary: 70 mg/dL (ref 70–99)
Glucose-Capillary: 79 mg/dL (ref 70–99)
Glucose-Capillary: 106 mg/dL — ABNORMAL HIGH (ref 70–99)
Glucose-Capillary: 84 mg/dL (ref 70–99)

## 2020-11-24 MED ORDER — DOCUSATE SODIUM 100 MG PO CAPS
100.0000 mg | ORAL_CAPSULE | Freq: Two times a day (BID) | ORAL | Status: DC | PRN
  Administered 2020-11-24: 100 mg via ORAL

## 2020-11-24 NOTE — Plan of Care (Signed)
  Problem: Elimination: Goal: Will not experience complications related to bowel motility 11/24/2020 2113 by Wendy Poet, RN Outcome: Progressing 11/24/2020 2113 by Wendy Poet, RN Outcome: Progressing   Problem: Pain Managment: Goal: General experience of comfort will improve 11/24/2020 2113 by Wendy Poet, RN Outcome: Progressing 11/24/2020 2113 by Wendy Poet, RN Outcome: Progressing

## 2020-11-24 NOTE — Progress Notes (Signed)
Daily Antepartum Note  Admission Date: 11/18/2020 Current Date: 11/24/2020 12:54 PM  Rebecca Frey is a 30 y.o. G1 @ [redacted]w[redacted]d, HD#7, admitted for severe pre-eclampsia.  Pregnancy complicated by: Patient Active Problem List   Diagnosis Date Noted  . Vaginal bleeding in pregnancy, third trimester 11/19/2020  . Chronic hypertension with superimposed preeclampsia 11/18/2020  . Gestational diabetes 10/27/2020  . Fetal growth retardation, antenatal 10/08/2020  . Chronic hypertension during pregnancy, antepartum 10/08/2020  . Elevated AFP 09/10/2020  . Rh negative state in antepartum period 08/13/2020  . Supervision of high-risk pregnancy 07/15/2020    Overnight/24hr events:  None  Subjective:  No s/s of pre-eclampsia, VB or labor  Objective:    Current Vital Signs 24h Vital Sign Ranges  T 98.8 F (37.1 C) Temp  Avg: 98.1 F (36.7 C)  Min: 97.6 F (36.4 C)  Max: 98.8 F (37.1 C)  BP (!) 132/47 BP  Min: 110/60  Max: 142/81  HR 66 Pulse  Avg: 68.4  Min: 66  Max: 70  RR 18 Resp  Avg: 18.6  Min: 17  Max: 20  SaO2 98 %  (Room Air) SpO2  Avg: 97.5 %  Min: 96 %  Max: 99 %       24 Hour I/O Current Shift I/O  Time Ins Outs No intake/output data recorded. No intake/output data recorded.   Patient Vitals for the past 24 hrs:  BP Temp Temp src Pulse Resp SpO2  11/24/20 1103 (!) 132/47 98.8 F (37.1 C) Oral 66 18 98 %  11/24/20 0831 110/60 97.8 F (36.6 C) Oral 68 18 96 %  11/24/20 0622 136/65 97.6 F (36.4 C) Oral 67 19 98 %  11/24/20 0211 137/88 97.7 F (36.5 C) Oral 70 19 96 %  11/23/20 2203 (!) 142/81 -- -- 69 20 --  11/23/20 1936 136/70 98.4 F (36.9 C) Oral 69 19 98 %  11/23/20 1540 134/73 98.2 F (36.8 C) Oral 70 17 99 %    FHT: 135 baseline, +accels, no decel, mod variability Toco: quiet x 15m  Physical exam: General: Well nourished, well developed female in no acute distress. Abdomen: gravid nttp Cardiovascular: S1, S2 normal, no murmur, rub or gallop, regular rate  and rhythm Respiratory: CTAB Neuro: 1+ brachial b/l  Extremities: no clubbing, cyanosis or edema Skin: Warm and dry.   Medications: Current Facility-Administered Medications  Medication Dose Route Frequency Provider Last Rate Last Admin  . acetaminophen (TYLENOL) tablet 650 mg  650 mg Oral Q4H PRN Calvert Cantor, CNM   650 mg at 11/22/20 1325  . calcium carbonate (TUMS - dosed in mg elemental calcium) chewable tablet 400 mg of elemental calcium  2 tablet Oral Q4H PRN Clayton Bibles C, CNM   400 mg of elemental calcium at 11/22/20 2214  . docusate sodium (COLACE) capsule 100 mg  100 mg Oral BID PRN [place] Bing, MD   100 mg at 11/24/20 1037  . labetalol (NORMODYNE) injection 20 mg  20 mg Intravenous PRN Clayton Bibles C, CNM       And  . labetalol (NORMODYNE) injection 40 mg  40 mg Intravenous PRN Clayton Bibles C, CNM       And  . labetalol (NORMODYNE) injection 80 mg  80 mg Intravenous PRN Clayton Bibles C, CNM       And  . hydrALAZINE (APRESOLINE) injection 10 mg  10 mg Intravenous PRN Calvert Cantor, CNM      . insulin aspart (novoLOG) injection 0-20 Units  0-20 Units Subcutaneous TID PC Reva Bores, MD   3 Units at 11/19/20 1622  . labetalol (NORMODYNE) injection 10 mg  10 mg Intravenous Q10 min PRN Dione Booze, MD   10 mg at 11/18/20 0342  . labetalol (NORMODYNE) tablet 200 mg  200 mg Oral BID Reva Bores, MD   200 mg at 11/24/20 1037  . prenatal multivitamin tablet 1 tablet  1 tablet Oral Q1200 Clayton Bibles C, CNM   1 tablet at 11/24/20 1037  . zolpidem (AMBIEN) tablet 5 mg  5 mg Oral QHS PRN Calvert Cantor, [place]        Labs:  Recent Labs  Lab 11/18/20 0211 11/19/20 0454 11/21/20 0909  WBC 12.2* 11.4* 12.5*  HGB 11.5* 11.0* 11.2*  HCT 34.6* 33.9* 33.6*  PLT 198 193 222    Recent Labs  Lab 11/18/20 0211 11/19/20 0454 11/21/20 0909  NA 135 138 138  K 3.6 3.9 4.8  CL 107 107 106  CO2 22 21* 22  BUN 7 5* 10   CREATININE 0.64 0.63 0.78  CALCIUM 8.9 7.2* 8.9  PROT 6.9 6.2* 6.6  BILITOT 0.4 0.5 0.5  ALKPHOS 98 96 101  ALT 23 29 38  AST 27 26 29   GLUCOSE 92 103* 74   Results for MORANDA, BILLIOT (MRN Jonnie Finner) as of 11/24/2020 13:53  Ref. Range 11/23/2020 15:58 11/23/2020 20:30 11/24/2020 06:21 11/24/2020 06:54 11/24/2020 13:50  Glucose-Capillary Latest Ref Range: 70 - 99 mg/dL 11/26/2020 (H) 88 65 (L) 70 79   Radiology:  2/11: efw 4%, 1354gm,  transverse, afi 12, bpp 8/8, elevated UA dopplers  Assessment & Plan:  Pt stable *Pregnancy: maternal fetal status reassuring. Reactive NST. Continue with qshift NSTs *Severe pre-eclampsia superimposed on cHTN: started on labetalol on 2/9; watch BPs as may have to go down on dose. Repeat labs tomorrow and q72h -s/p Mg *FGR: rpt BPP on 2/18 *Preterm: s/p bmz on 2/8 and 2/9, NICU consult *GDM: need meds after bmz but currently doing fine w/o meds *Rh neg: s/p rhogam on 1/21 *VB: none currently. Last on 2/8-2/9 *PPx: SCDs, OOB ad lib *FEN/GI: DM2 diet *Dispo: d/w her will need to be inpatient for delivery and goal for 34wks and indications for earlier delivery. I also d/w her may need c/s depending on presentation and fetal status.   07-29-2002 MD Attending [facility] GYN Consult Phone: [phone] (M-F, [phone]) & [phone] (Off hours, weekends, holidays)

## 2020-11-24 NOTE — Progress Notes (Signed)
Hypoglycemic Event  CBG: 65 @0620   Treatment: 4oz juice, crackers, and peanut butter   Symptoms: None noted  Follow-up CBG: CBG Result:70  Possible Reasons for Event: Unknown. Patient ate bedtime snack around 2100 then fasted till morning CBG.      Rebecca Frey R Rebecca Frey

## 2020-11-25 DIAGNOSIS — O24410 Gestational diabetes mellitus in pregnancy, diet controlled: Secondary | ICD-10-CM | POA: Diagnosis not present

## 2020-11-25 DIAGNOSIS — R7401 Elevation of levels of liver transaminase levels: Secondary | ICD-10-CM | POA: Diagnosis not present

## 2020-11-25 DIAGNOSIS — O10013 Pre-existing essential hypertension complicating pregnancy, third trimester: Secondary | ICD-10-CM | POA: Diagnosis not present

## 2020-11-25 DIAGNOSIS — Z3A31 31 weeks gestation of pregnancy: Secondary | ICD-10-CM | POA: Diagnosis not present

## 2020-11-25 DIAGNOSIS — O1413 Severe pre-eclampsia, third trimester: Secondary | ICD-10-CM | POA: Diagnosis not present

## 2020-11-25 LAB — CBC
HCT: 34.4 % — ABNORMAL LOW (ref 36.0–46.0)
Hemoglobin: 11.3 g/dL — ABNORMAL LOW (ref 12.0–15.0)
MCH: 29.6 pg (ref 26.0–34.0)
MCHC: 32.8 g/dL (ref 30.0–36.0)
MCV: 90.1 fL (ref 80.0–100.0)
Platelets: 189 10*3/uL (ref 150–400)
RBC: 3.82 MIL/uL — ABNORMAL LOW (ref 3.87–5.11)
RDW: 15.2 % (ref 11.5–15.5)
WBC: 12.5 10*3/uL — ABNORMAL HIGH (ref 4.0–10.5)
nRBC: 0 % (ref 0.0–0.2)

## 2020-11-25 LAB — GLUCOSE, CAPILLARY
Glucose-Capillary: 62 mg/dL — ABNORMAL LOW (ref 70–99)
Glucose-Capillary: 72 mg/dL (ref 70–99)
Glucose-Capillary: 67 mg/dL — ABNORMAL LOW (ref 70–99)
Glucose-Capillary: 95 mg/dL (ref 70–99)
Glucose-Capillary: 72 mg/dL (ref 70–99)
Glucose-Capillary: 89 mg/dL (ref 70–99)

## 2020-11-25 LAB — COMPREHENSIVE METABOLIC PANEL
ALT: 53 U/L — ABNORMAL HIGH (ref 0–44)
AST: 34 U/L (ref 15–41)
Albumin: 2.5 g/dL — ABNORMAL LOW (ref 3.5–5.0)
Alkaline Phosphatase: 120 U/L (ref 38–126)
Anion gap: 9 (ref 5–15)
BUN: 15 mg/dL (ref 6–20)
CO2: 22 mmol/L (ref 22–32)
Calcium: 9.5 mg/dL (ref 8.9–10.3)
Chloride: 104 mmol/L (ref 98–111)
Creatinine, Ser: 0.72 mg/dL (ref 0.44–1.00)
GFR, Estimated: >60 mL/min (ref >60)
Glucose, Bld: 76 mg/dL (ref 70–99)
Potassium: 4.2 mmol/L (ref 3.5–5.1)
Sodium: 135 mmol/L (ref 135–145)
Total Bilirubin: <0.1 mg/dL — ABNORMAL LOW (ref 0.3–1.2)
Total Protein: 6.5 g/dL (ref 6.5–8.1)

## 2020-11-25 LAB — TYPE AND SCREEN
ABO/RH(D): A NEG
Antibody Screen: POSITIVE

## 2020-11-25 MED ORDER — LABETALOL HCL 100 MG PO TABS
100.0000 mg | ORAL_TABLET | Freq: Two times a day (BID) | ORAL | Status: DC
  Administered 2020-11-25 (×2): 100 mg via ORAL
  Filled 2020-11-25 (×2): qty 1

## 2020-11-25 NOTE — Progress Notes (Signed)
Daily Antepartum Note  Admission Date: 11/18/2020 Current Date: 11/25/2020 9:09 AM  Rebecca Frey is a 30 y.o. G1 @ [redacted]w[redacted]d, HD#8, admitted for severe pre-eclampsia.  Pregnancy complicated by: Patient Active Problem List   Diagnosis Date Noted  . Vaginal bleeding in pregnancy, third trimester 11/19/2020  . Chronic hypertension with superimposed preeclampsia 11/18/2020  . Gestational diabetes 10/27/2020  . Fetal growth retardation, antenatal 10/08/2020  . Chronic hypertension during pregnancy, antepartum 10/08/2020  . Elevated AFP 09/10/2020  . Rh negative state in antepartum period 08/13/2020  . Supervision of high-risk pregnancy 07/15/2020    Overnight/24hr events:  None  Subjective:  No s/s of pre-eclampsia, VB or labor  Objective:    Current Vital Signs 24h Vital Sign Ranges  T 98.3 F (36.8 C) Temp  Avg: 98.5 F (36.9 C)  Min: 98.3 F (36.8 C)  Max: 98.8 F (37.1 C)  BP 128/64 BP  Min: 124/60  Max: 148/76  HR 63 Pulse  Avg: 64.7  Min: 63  Max: 67  RR 19 Resp  Avg: 18  Min: 17  Max: 19  SaO2 98 % Room Air SpO2  Avg: 98.8 %  Min: 98 %  Max: 100 %       24 Hour I/O Current Shift I/O  Time Ins Outs No intake/output data recorded. No intake/output data recorded.   Patient Vitals for the past 24 hrs:  BP Temp Temp src Pulse Resp SpO2  11/25/20 0849 128/64 98.3 F (36.8 C) Oral 63 19 98 %  11/25/20 0552 124/60 -- -- 64 -- --  11/25/20 0551 124/60 98.7 F (37.1 C) Oral 66 18 100 %  11/24/20 2234 134/70 98.4 F (36.9 C) -- 64 18 99 %  11/24/20 1953 137/76 98.5 F (36.9 C) Oral 63 17 98 %  11/24/20 1513 (!) 148/76 98.3 F (36.8 C) Oral 67 18 100 %  11/24/20 1103 (!) 132/47 98.8 F (37.1 C) Oral 66 18 98 %    FHT: 135 baseline, +accels, no decel, mod variability Toco: quiet x 4m  Physical exam: General: Well nourished, well developed female in no acute distress. Abdomen: gravid nttp Cardiovascular: S1, S2 normal, no murmur, rub or gallop, regular rate and  rhythm Respiratory: CTAB Extremities: no clubbing, cyanosis or edema Skin: Warm and dry.   Medications: Current Facility-Administered Medications  Medication Dose Route Frequency Provider Last Rate Last Admin  . acetaminophen (TYLENOL) tablet 650 mg  650 mg Oral Q4H PRN Calvert Cantor, CNM   650 mg at 11/24/20 2009  . calcium carbonate (TUMS - dosed in mg elemental calcium) chewable tablet 400 mg of elemental calcium  2 tablet Oral Q4H PRN Clayton Bibles C, CNM   400 mg of elemental calcium at 11/22/20 2214  . docusate sodium (COLACE) capsule 100 mg  100 mg Oral BID PRN [place] Bing, MD   100 mg at 11/24/20 1037  . labetalol (NORMODYNE) injection 20 mg  20 mg Intravenous PRN Clayton Bibles C, CNM       And  . labetalol (NORMODYNE) injection 40 mg  40 mg Intravenous PRN Clayton Bibles C, CNM       And  . labetalol (NORMODYNE) injection 80 mg  80 mg Intravenous PRN Clayton Bibles C, CNM       And  . hydrALAZINE (APRESOLINE) injection 10 mg  10 mg Intravenous PRN Reita Cliche, Samantha C, CNM      . labetalol (NORMODYNE) injection 10 mg  10 mg Intravenous Q10 min PRN Preston Fleeting,  Onalee Hua, MD   10 mg at 11/18/20 0342  . labetalol (NORMODYNE) tablet 200 mg  200 mg Oral BID Reva Bores, MD   200 mg at 11/24/20 2151  . prenatal multivitamin tablet 1 tablet  1 tablet Oral Q1200 Clayton Bibles C, CNM   1 tablet at 11/24/20 1037  . zolpidem (AMBIEN) tablet 5 mg  5 mg Oral QHS PRN Calvert Cantor, [place]        Labs:  Recent Labs  Lab 11/19/20 [phone] 11/21/20 0909 11/25/20 0416  WBC 11.4* 12.5* 12.5*  HGB 11.0* 11.2* 11.3*  HCT 33.9* 33.6* 34.4*  PLT 193 222 189    Recent Labs  Lab 11/19/20 0454 11/21/20 0909 11/25/20 0416  NA 138 138 135  K 3.9 4.8 4.2  CL 107 106 104  CO2 21* 22 22  BUN 5* 10 15  CREATININE 0.63 0.78 0.72  CALCIUM 7.2* 8.9 9.5  PROT 6.2* 6.6 6.5  BILITOT 0.5 0.5 <0.1*  ALKPHOS 96 101 120  ALT 29 38 53*  AST 26 29 34  GLUCOSE 103* 74 76    Results for JORDANN, GRIME (MRN [id number]) as of 11/25/2020 11:21  Ref. Range 11/24/2020 13:50 11/24/2020 16:57 11/24/2020 20:34 11/25/2020 05:53 11/25/2020 08:51  Glucose-Capillary Latest Ref Range: 70 - 99 mg/dL 79 675 (H) 84 62 (L) 72   Radiology:  2/11: efw 4%, 1354gm,  transverse, afi 12, bpp 8/8, elevated UA dopplers  Assessment & Plan:  Pt stable *Pregnancy: maternal fetal status reassuring. Reactive NST. Continue with qshift NSTs *Severe pre-eclampsia superimposed on cHTN: started on labetalol on 2/9; labetalol decreased to 100 bid. Rpt labs in the AM. D/c apap -s/p Mg *FGR: rpt BPP on 2/18 *Preterm: s/p bmz on 2/8 and 2/9, NICU consult *GDM: need meds after bmz but currently doing fine w/o meds *Rh neg: s/p rhogam on 1/21 *VB: none currently. Last on 2/8-2/9 *PPx: SCDs, OOB ad lib *FEN/GI: DM2 diet *Dispo: d/w her will need to be inpatient for delivery and goal for 34wks and indications for earlier delivery. I also d/w her may need c/s depending on presentation and fetal status.   Cornelia Copa MD Attending [facility]) GYN Consult Phone: [phone] (M-F, [phone]) & [phone] (Off hours, weekends, holidays)

## 2020-11-25 NOTE — Progress Notes (Signed)
Initial Nutrition Assessment  DOCUMENTATION CODES:  Morbid obesity  INTERVENTION:  Carbohydrate modified gestational diabetic diet Double protein portions if pts makes request when ordering  NUTRITION DIAGNOSIS:  Increased nutrient needs related to  (pregnancy and fetal growth requirements) as evidenced by  (31 weeks IUP).  GOAL:  Patient will meet greater than or equal to 90% of their needs  MONITOR:  Labs  REASON FOR ASSESSMENT:  Antenatal,LOS   ASSESSMENT:  Now 31 4/7 weeks, adm with PEC, diet controlled GDM. Weight at initial prenatal visit 121.5 kg, BMI 46. Morbid obesity. No weight gain to date. GDM education outpt on 10/29/20. CBG's wnl. Pt here until delivery at [redacted] weeks GA  Diet Order:   Diet Order            Diet gestational carb mod Fluid consistency: Thin; Room service appropriate? Yes  Diet effective now                EDUCATION NEEDS:   No education needs have been identified at this time  Skin:  Skin Assessment: Reviewed RN Assessment  Height:   Ht Readings from Last 1 Encounters:  11/18/20 5\' 4"  (1.626 m)   Weight:   Wt Readings from Last 1 Encounters:  11/18/20 121.6 kg   Ideal Body Weight:   120 lbs  BMI:  Body mass index is 46.02 kg/m.  Estimated Nutritional Needs:   Kcal:  2500-2700  Protein:  110-120 g  Fluid:  2.7 L

## 2020-11-25 NOTE — Progress Notes (Signed)
Rebecca Frey reports she's feeling a little better this week. She reports talking with her mother and husband is helpful. She shared that her BMZ shots were painful, but less so when her husband was able to be with her. She thinks she'll need two more next week. Chaplain encouraged her to talk to her providers to see if they could be scheduled for a time when her husband is able to be present and to call for chaplain support if not.   Please page as further needs arise.  Maryanna Shape. Carley Hammed, M.Div. [facility] Chaplain Pager [phone] Office [phone]

## 2020-11-25 NOTE — Progress Notes (Signed)
Hypoglycemic Event  CBG: 62 @0553 , RN notified @ [phone]  Treatment: RN offered 4 oz of juice and crackers @ 0620. Patient did not want it and went back to sleep.  Symptoms: None noted  Possible Reasons for Event: Patient did not eat bedtime snack, due to have a later dinner, and fasted until morning CBG.   Notified MD: 2297 MD was notified @0652  about hypoglycemic event without symptoms, and attempted treatment that was not completed by patient. MD stated with no symptoms of hypoglycemia, no new orders needed.   RN will continue to monitor patient.      Debroah Loop

## 2020-11-26 ENCOUNTER — Inpatient Hospital Stay (HOSPITAL_BASED_OUTPATIENT_CLINIC_OR_DEPARTMENT_OTHER): Payer: Medicaid Other

## 2020-11-26 DIAGNOSIS — O24410 Gestational diabetes mellitus in pregnancy, diet controlled: Secondary | ICD-10-CM

## 2020-11-26 DIAGNOSIS — O365930 Maternal care for other known or suspected poor fetal growth, third trimester, not applicable or unspecified: Secondary | ICD-10-CM

## 2020-11-26 DIAGNOSIS — O10013 Pre-existing essential hypertension complicating pregnancy, third trimester: Secondary | ICD-10-CM | POA: Diagnosis not present

## 2020-11-26 DIAGNOSIS — O360130 Maternal care for anti-D [Rh] antibodies, third trimester, not applicable or unspecified: Secondary | ICD-10-CM

## 2020-11-26 DIAGNOSIS — O288 Other abnormal findings on antenatal screening of mother: Secondary | ICD-10-CM | POA: Diagnosis not present

## 2020-11-26 DIAGNOSIS — Z3A31 31 weeks gestation of pregnancy: Secondary | ICD-10-CM

## 2020-11-26 DIAGNOSIS — O113 Pre-existing hypertension with pre-eclampsia, third trimester: Secondary | ICD-10-CM | POA: Diagnosis not present

## 2020-11-26 DIAGNOSIS — O1413 Severe pre-eclampsia, third trimester: Secondary | ICD-10-CM | POA: Diagnosis not present

## 2020-11-26 LAB — GLUCOSE, CAPILLARY
Glucose-Capillary: 74 mg/dL (ref 70–99)
Glucose-Capillary: 121 mg/dL — ABNORMAL HIGH (ref 70–99)
Glucose-Capillary: 81 mg/dL (ref 70–99)
Glucose-Capillary: 103 mg/dL — ABNORMAL HIGH (ref 70–99)

## 2020-11-26 LAB — CBC
HCT: 32.7 % — ABNORMAL LOW (ref 36.0–46.0)
Hemoglobin: 11.2 g/dL — ABNORMAL LOW (ref 12.0–15.0)
MCH: 30.4 pg (ref 26.0–34.0)
MCHC: 34.3 g/dL (ref 30.0–36.0)
MCV: 88.9 fL (ref 80.0–100.0)
Platelets: 189 10*3/uL (ref 150–400)
RBC: 3.68 MIL/uL — ABNORMAL LOW (ref 3.87–5.11)
RDW: 15.2 % (ref 11.5–15.5)
WBC: 12.5 10*3/uL — ABNORMAL HIGH (ref 4.0–10.5)
nRBC: 0 % (ref 0.0–0.2)

## 2020-11-26 LAB — COMPREHENSIVE METABOLIC PANEL
ALT: 51 U/L — ABNORMAL HIGH (ref 0–44)
AST: 31 U/L (ref 15–41)
Albumin: 2.4 g/dL — ABNORMAL LOW (ref 3.5–5.0)
Alkaline Phosphatase: 112 U/L (ref 38–126)
Anion gap: 11 (ref 5–15)
BUN: 14 mg/dL (ref 6–20)
CO2: 21 mmol/L — ABNORMAL LOW (ref 22–32)
Calcium: 9.4 mg/dL (ref 8.9–10.3)
Chloride: 104 mmol/L (ref 98–111)
Creatinine, Ser: 0.66 mg/dL (ref 0.44–1.00)
GFR, Estimated: >60 mL/min (ref >60)
Glucose, Bld: 69 mg/dL — ABNORMAL LOW (ref 70–99)
Potassium: 4 mmol/L (ref 3.5–5.1)
Sodium: 136 mmol/L (ref 135–145)
Total Bilirubin: 0.1 mg/dL — ABNORMAL LOW (ref 0.3–1.2)
Total Protein: 6.3 g/dL — ABNORMAL LOW (ref 6.5–8.1)

## 2020-11-26 MED ORDER — ALUM & MAG HYDROXIDE-SIMETH 200-200-20 MG/5ML PO SUSP
30.0000 mL | Freq: Once | ORAL | Status: AC
  Administered 2020-11-26: 30 mL via ORAL
  Filled 2020-11-26: qty 30

## 2020-11-26 MED ORDER — LABETALOL HCL 200 MG PO TABS
200.0000 mg | ORAL_TABLET | Freq: Two times a day (BID) | ORAL | Status: DC
  Administered 2020-11-26 – 2020-11-28 (×5): 200 mg via ORAL
  Filled 2020-11-26 (×5): qty 1

## 2020-11-26 MED ORDER — PANTOPRAZOLE SODIUM 40 MG PO TBEC
40.0000 mg | DELAYED_RELEASE_TABLET | Freq: Every day | ORAL | Status: DC
  Administered 2020-11-26 – 2020-11-27 (×2): 40 mg via ORAL
  Filled 2020-11-26 (×2): qty 1

## 2020-11-26 MED ORDER — OXYCODONE HCL 5 MG PO TABS
5.0000 mg | ORAL_TABLET | Freq: Once | ORAL | Status: AC
  Administered 2020-11-26: 5 mg via ORAL
  Filled 2020-11-26: qty 1

## 2020-11-26 MED ORDER — SODIUM CHLORIDE 0.9% FLUSH
3.0000 mL | Freq: Two times a day (BID) | INTRAVENOUS | Status: DC
  Administered 2020-11-26 – 2020-11-27 (×2): 3 mL via INTRAVENOUS

## 2020-11-26 NOTE — Progress Notes (Signed)
I provided listening presence for Rebecca Frey to share about her scare last night with her blood pressure and her baby's decel.  She had a difficult time getting to sleep after the event and was tired today.  She was looking forward to her husband being here tonight.    Chaplain Dyanne Carrel, Bcc Pager, [phone] 4:13 PM

## 2020-11-26 NOTE — Progress Notes (Addendum)
Notified Dr. Vergie Living of 4 minute prolonged decel. MD said to leave pt on until 12:15 and pt will be clear to come off monitor if NST is reassuring.

## 2020-11-26 NOTE — Progress Notes (Signed)
Daily Antepartum Note  Admission Date: 11/18/2020 Current Date: 11/26/2020 10:27 AM  Rebecca Frey is a 30 y.o. G1 @ [redacted]w[redacted]d, HD#9, admitted for severe pre-eclampsia.  Pregnancy complicated by: Patient Active Problem List   Diagnosis Date Noted  . Elevated ALT measurement 11/25/2020  . Vaginal bleeding in pregnancy, third trimester 11/19/2020  . Chronic hypertension with superimposed preeclampsia 11/18/2020  . Gestational diabetes 10/27/2020  . Fetal growth retardation, antenatal 10/08/2020  . Chronic hypertension during pregnancy, antepartum 10/08/2020  . Elevated AFP 09/10/2020  . Rh negative state in antepartum period 08/13/2020  . Supervision of high-risk pregnancy 07/15/2020    Overnight/24hr events:  Severe range BPs overnight that needed IV meds. Labetalol increased back up to 200 bid from 100 bid.   Subjective:  No s/s of pre-eclampsia, VB or labor  Objective:    Current Vital Signs 24h Vital Sign Ranges  T 98.4 F (36.9 C) Temp  Avg: 98.3 F (36.8 C)  Min: 97.9 F (36.6 C)  Max: 98.6 F (37 C)  BP 127/74 BP  Min: 124/66  Max: 179/96  HR 69 Pulse  Avg: 66.4  Min: 62  Max: 71  RR 18 Resp  Avg: 18.3  Min: 18  Max: 19  SaO2 99 % Room Air SpO2  Avg: 98.2 %  Min: 96 %  Max: 99 %       24 Hour I/O Current Shift I/O  Time Ins Outs No intake/output data recorded. No intake/output data recorded.   Patient Vitals for the past 24 hrs:  BP Temp Temp src Pulse Resp SpO2  11/26/20 0900 127/74 98.4 F (36.9 C) Oral 69 18 99 %  11/26/20 0441 128/73 97.9 F (36.6 C) Oral 66 19 96 %  11/26/20 0022 134/77 -- -- 66 -- --  11/26/20 0008 (!) 167/85 -- -- 66 -- --  11/25/20 2344 (!) 178/93 -- -- 67 -- --  11/25/20 2335 (!) 179/96 -- -- 62 -- --  11/25/20 2317 (!) 174/85 98.6 F (37 C) Oral 62 19 99 %  11/25/20 1919 (!) 143/72 98.6 F (37 C) Oral 69 18 99 %  11/25/20 1535 (!) 132/57 98 F (36.7 C) Oral 66 18 99 %  11/25/20 1120 124/66 98.2 F (36.8 C) Oral 71 18 97 %     FHT: 135 baseline, +accels, one decel overnight that responded to position change, mod variability Toco: quiet x 60m  Physical exam: General: Well nourished, well developed female in no acute distress. Abdomen: gravid nttp Cardiovascular: S1, S2 normal, no murmur, rub or gallop, regular rate and rhythm Respiratory: CTAB Extremities: no clubbing, cyanosis or edema Skin: Warm and dry.   Medications: Current Facility-Administered Medications  Medication Dose Route Frequency Provider Last Rate Last Admin  . calcium carbonate (TUMS - dosed in mg elemental calcium) chewable tablet 400 mg of elemental calcium  2 tablet Oral Q4H PRN Clayton Bibles C, CNM   400 mg of elemental calcium at 11/25/20 2357  . docusate sodium (COLACE) capsule 100 mg  100 mg Oral BID PRN [place] Bing, MD   100 mg at 11/24/20 1037  . labetalol (NORMODYNE) injection 20 mg  20 mg Intravenous PRN Clayton Bibles C, CNM   20 mg at 11/25/20 2356   And  . labetalol (NORMODYNE) injection 40 mg  40 mg Intravenous PRN Clayton Bibles C, CNM   40 mg at 11/26/20 0012   And  . labetalol (NORMODYNE) injection 80 mg  80 mg Intravenous PRN Calvert Cantor,  CNM       And  . hydrALAZINE (APRESOLINE) injection 10 mg  10 mg Intravenous PRN Reita Cliche, Samantha C, CNM      . labetalol (NORMODYNE) injection 10 mg  10 mg Intravenous Q10 min PRN Dione Booze, MD   10 mg at 11/18/20 0342  . labetalol (NORMODYNE) tablet 200 mg  200 mg Oral BID Anyanwu, Ugonna A, MD      . prenatal multivitamin tablet 1 tablet  1 tablet Oral Q1200 Clayton Bibles C, CNM   1 tablet at 11/25/20 1145  . zolpidem (AMBIEN) tablet 5 mg  5 mg Oral QHS PRN Calvert Cantor, [place]        Labs:  Recent Labs  Lab 11/21/20 [phone] 11/25/20 0416 11/26/20 0503  WBC 12.5* 12.5* 12.5*  HGB 11.2* 11.3* 11.2*  HCT 33.6* 34.4* 32.7*  PLT 222 189 189    Recent Labs  Lab 11/21/20 0909 11/25/20 0416 11/26/20 0503  NA 138 135 136  K 4.8 4.2  4.0  CL 106 104 104  CO2 22 22 21*  BUN 10 15 14   CREATININE 0.78 0.72 0.66  CALCIUM 8.9 9.5 9.4  PROT 6.6 6.5 6.3*  BILITOT 0.5 <0.1* 0.1*  ALKPHOS 101 120 112  ALT 38 53* 51*  AST 29 34 31  GLUCOSE 74 76 69*   Results for SHONDREA, STEINERT (MRN Jonnie Finner) as of 11/26/2020 10:29  Ref. Range 11/25/2020 12:14 11/25/2020 14:08 11/25/2020 16:26 11/25/2020 21:02 11/26/2020 09:04  Glucose-Capillary Latest Ref Range: 70 - 99 mg/dL 67 (L) 95 72 89 74   Radiology:  2/11: efw 4%, 1354gm,  transverse, afi 12, bpp 8/8, elevated UA dopplers  Assessment & Plan:  Pt stable *Pregnancy: maternal fetal status reassuring. Reactive NST. Continue with qshift NSTs *Severe pre-eclampsia superimposed on cHTN: continue current plan of care. LFTs stable. Rpt labs 2/18 -s/p Mg *FGR: rpt BPP on 2/18 *Preterm: s/p bmz on 2/8 and 2/9, NICU consult *GDM: need meds after bmz but currently doing fine w/o meds *Rh neg: s/p rhogam on 1/21 *VB: none currently. Last on 2/8-2/9 *PPx: SCDs, OOB ad lib *FEN/GI: DM2 diet *Dispo: d/w her will need to be inpatient for delivery and goal for 34wks and indications for earlier delivery. I also d/w her may need c/s depending on presentation and fetal status.   07-29-2002 MD Attending Center for [facility] (Faculty [facility]) GYN Consult Phone: [phone] (M-F, [phone]) & [phone] (Off hours, weekends, holidays)

## 2020-11-27 ENCOUNTER — Encounter: Payer: Self-pay | Admitting: Obstetrics and Gynecology

## 2020-11-27 ENCOUNTER — Inpatient Hospital Stay (HOSPITAL_BASED_OUTPATIENT_CLINIC_OR_DEPARTMENT_OTHER): Payer: Medicaid Other

## 2020-11-27 DIAGNOSIS — O1413 Severe pre-eclampsia, third trimester: Secondary | ICD-10-CM | POA: Diagnosis not present

## 2020-11-27 DIAGNOSIS — O24410 Gestational diabetes mellitus in pregnancy, diet controlled: Secondary | ICD-10-CM

## 2020-11-27 DIAGNOSIS — Z3A31 31 weeks gestation of pregnancy: Secondary | ICD-10-CM | POA: Diagnosis not present

## 2020-11-27 DIAGNOSIS — O320XX0 Maternal care for unstable lie, not applicable or unspecified: Secondary | ICD-10-CM | POA: Insufficient documentation

## 2020-11-27 DIAGNOSIS — O360130 Maternal care for anti-D [Rh] antibodies, third trimester, not applicable or unspecified: Secondary | ICD-10-CM

## 2020-11-27 DIAGNOSIS — O365930 Maternal care for other known or suspected poor fetal growth, third trimester, not applicable or unspecified: Secondary | ICD-10-CM

## 2020-11-27 DIAGNOSIS — O113 Pre-existing hypertension with pre-eclampsia, third trimester: Secondary | ICD-10-CM | POA: Diagnosis not present

## 2020-11-27 DIAGNOSIS — O10013 Pre-existing essential hypertension complicating pregnancy, third trimester: Secondary | ICD-10-CM | POA: Diagnosis not present

## 2020-11-27 LAB — CBC
HCT: 32.3 % — ABNORMAL LOW (ref 36.0–46.0)
HCT: 32.8 % — ABNORMAL LOW (ref 36.0–46.0)
Hemoglobin: 11.2 g/dL — ABNORMAL LOW (ref 12.0–15.0)
Hemoglobin: 11.5 g/dL — ABNORMAL LOW (ref 12.0–15.0)
MCH: 30.6 pg (ref 26.0–34.0)
MCH: 30.8 pg (ref 26.0–34.0)
MCHC: 34.7 g/dL (ref 30.0–36.0)
MCHC: 35.1 g/dL (ref 30.0–36.0)
MCV: 88.3 fL (ref 80.0–100.0)
MCV: 87.9 fL (ref 80.0–100.0)
Platelets: 166 10*3/uL (ref 150–400)
Platelets: 160 10*3/uL (ref 150–400)
RBC: 3.66 MIL/uL — ABNORMAL LOW (ref 3.87–5.11)
RBC: 3.73 MIL/uL — ABNORMAL LOW (ref 3.87–5.11)
RDW: 15.1 % (ref 11.5–15.5)
RDW: 15.2 % (ref 11.5–15.5)
WBC: 11.6 10*3/uL — ABNORMAL HIGH (ref 4.0–10.5)
WBC: 14.5 10*3/uL — ABNORMAL HIGH (ref 4.0–10.5)
nRBC: 0 % (ref 0.0–0.2)
nRBC: 0 % (ref 0.0–0.2)

## 2020-11-27 LAB — GLUCOSE, CAPILLARY
Glucose-Capillary: 76 mg/dL (ref 70–99)
Glucose-Capillary: 94 mg/dL (ref 70–99)
Glucose-Capillary: 93 mg/dL (ref 70–99)

## 2020-11-27 LAB — COMPREHENSIVE METABOLIC PANEL
ALT: 62 U/L — ABNORMAL HIGH (ref 0–44)
ALT: 75 U/L — ABNORMAL HIGH (ref 0–44)
AST: 41 U/L (ref 15–41)
AST: 54 U/L — ABNORMAL HIGH (ref 15–41)
Albumin: 2.5 g/dL — ABNORMAL LOW (ref 3.5–5.0)
Albumin: 2.5 g/dL — ABNORMAL LOW (ref 3.5–5.0)
Alkaline Phosphatase: 122 U/L (ref 38–126)
Alkaline Phosphatase: 126 U/L (ref 38–126)
Anion gap: 9 (ref 5–15)
Anion gap: 10 (ref 5–15)
BUN: 10 mg/dL (ref 6–20)
BUN: 12 mg/dL (ref 6–20)
CO2: 23 mmol/L (ref 22–32)
CO2: 21 mmol/L — ABNORMAL LOW (ref 22–32)
Calcium: 9.4 mg/dL (ref 8.9–10.3)
Calcium: 8.7 mg/dL — ABNORMAL LOW (ref 8.9–10.3)
Chloride: 105 mmol/L (ref 98–111)
Chloride: 102 mmol/L (ref 98–111)
Creatinine, Ser: 0.72 mg/dL (ref 0.44–1.00)
Creatinine, Ser: 0.81 mg/dL (ref 0.44–1.00)
GFR, Estimated: >60 mL/min (ref >60)
GFR, Estimated: >60 mL/min (ref >60)
Glucose, Bld: 71 mg/dL (ref 70–99)
Glucose, Bld: 99 mg/dL (ref 70–99)
Potassium: 4.3 mmol/L (ref 3.5–5.1)
Potassium: 4.1 mmol/L (ref 3.5–5.1)
Sodium: 137 mmol/L (ref 135–145)
Sodium: 133 mmol/L — ABNORMAL LOW (ref 135–145)
Total Bilirubin: 0.4 mg/dL (ref 0.3–1.2)
Total Bilirubin: 0.6 mg/dL (ref 0.3–1.2)
Total Protein: 6.4 g/dL — ABNORMAL LOW (ref 6.5–8.1)
Total Protein: 6.6 g/dL (ref 6.5–8.1)

## 2020-11-27 LAB — TYPE AND SCREEN
ABO/RH(D): A NEG
Antibody Screen: POSITIVE

## 2020-11-27 MED ORDER — SODIUM CHLORIDE 0.9 % IV SOLN
5.0000 10*6.[IU] | Freq: Once | INTRAVENOUS | Status: AC
  Administered 2020-11-27: 5 10*6.[IU] via INTRAVENOUS
  Filled 2020-11-27: qty 5

## 2020-11-27 MED ORDER — HYDRALAZINE HCL 20 MG/ML IJ SOLN: 10.0000 mg | INTRAMUSCULAR | Status: DC | PRN

## 2020-11-27 MED ORDER — LABETALOL HCL 5 MG/ML IV SOLN: 40.0000 mg | INTRAVENOUS | Status: DC | PRN

## 2020-11-27 MED ORDER — LIDOCAINE HCL (PF) 1 % IJ SOLN: 30.0000 mL | INTRAMUSCULAR | Status: DC | PRN

## 2020-11-27 MED ORDER — LABETALOL HCL 5 MG/ML IV SOLN: 80.0000 mg | INTRAVENOUS | Status: DC | PRN

## 2020-11-27 MED ORDER — PENICILLIN G POT IN DEXTROSE 60000 UNIT/ML IV SOLN
3.0000 10*6.[IU] | INTRAVENOUS | Status: DC
  Administered 2020-11-27 – 2020-11-28 (×4): 3 10*6.[IU] via INTRAVENOUS
  Filled 2020-11-27 (×5): qty 50

## 2020-11-27 MED ORDER — FENTANYL CITRATE (PF) 100 MCG/2ML IJ SOLN
50.0000 ug | INTRAMUSCULAR | Status: DC | PRN
  Administered 2020-11-28: 50 ug via INTRAVENOUS
  Filled 2020-11-27: qty 2

## 2020-11-27 MED ORDER — MAGNESIUM SULFATE 40 GM/1000ML IV SOLN
INTRAVENOUS | Status: AC
  Administered 2020-11-28: 2 g/h via INTRAVENOUS
  Filled 2020-11-27: qty 1000

## 2020-11-27 MED ORDER — ACETAMINOPHEN 325 MG PO TABS
650.0000 mg | ORAL_TABLET | ORAL | Status: DC | PRN
  Administered 2020-11-27 – 2020-11-28 (×2): 650 mg via ORAL
  Filled 2020-11-27 (×2): qty 2

## 2020-11-27 MED ORDER — MAGNESIUM SULFATE 40 GM/1000ML IV SOLN
2.0000 g/h | INTRAVENOUS | Status: DC
  Administered 2020-11-28: 2 g/h via INTRAVENOUS
  Filled 2020-11-27: qty 1000

## 2020-11-27 MED ORDER — OXYTOCIN BOLUS FROM INFUSION: 333.0000 mL | Freq: Once | INTRAVENOUS | Status: DC

## 2020-11-27 MED ORDER — LACTATED RINGERS IV SOLN: INTRAVENOUS | Status: DC

## 2020-11-27 MED ORDER — MISOPROSTOL 25 MCG QUARTER TABLET
25.0000 ug | ORAL_TABLET | ORAL | Status: DC | PRN
  Administered 2020-11-27 (×2): 25 ug via VAGINAL
  Filled 2020-11-27 (×3): qty 1

## 2020-11-27 MED ORDER — SOD CITRATE-CITRIC ACID 500-334 MG/5ML PO SOLN
30.0000 mL | ORAL | Status: DC | PRN
  Administered 2020-11-28: 30 mL via ORAL
  Filled 2020-11-27: qty 15

## 2020-11-27 MED ORDER — MAGNESIUM SULFATE BOLUS VIA INFUSION
4.0000 g | Freq: Once | INTRAVENOUS | Status: AC
  Administered 2020-11-27: 4 g via INTRAVENOUS
  Filled 2020-11-27: qty 1000

## 2020-11-27 MED ORDER — ZOLPIDEM TARTRATE 5 MG PO TABS: 5.0000 mg | ORAL_TABLET | Freq: Every evening | ORAL | Status: DC | PRN

## 2020-11-27 MED ORDER — LACTATED RINGERS IV SOLN
500.0000 mL | INTRAVENOUS | Status: DC | PRN
Start: 2020-11-27 — End: 2020-11-28
  Administered 2020-11-28 (×2): 250 mL via INTRAVENOUS

## 2020-11-27 MED ORDER — LABETALOL HCL 5 MG/ML IV SOLN: 20.0000 mg | INTRAVENOUS | Status: DC | PRN

## 2020-11-27 MED ORDER — OXYTOCIN-SODIUM CHLORIDE 30-0.9 UT/500ML-% IV SOLN: 2.5000 [IU]/h | INTRAVENOUS | Status: DC

## 2020-11-27 MED ORDER — TERBUTALINE SULFATE 1 MG/ML IJ SOLN
0.2500 mg | Freq: Once | INTRAMUSCULAR | Status: DC | PRN
Start: 2020-11-27 — End: 2020-11-28

## 2020-11-27 MED ORDER — ONDANSETRON HCL 4 MG/2ML IJ SOLN: 4.0000 mg | Freq: Four times a day (QID) | INTRAMUSCULAR | Status: DC | PRN

## 2020-11-27 NOTE — Progress Notes (Signed)
Daily Antepartum Note  Admission Date: 11/18/2020 Current Date: 11/27/2020 9:02 AM  Rebecca Frey is a 30 y.o. G1 @ [redacted]w[redacted]d, HD#10, admitted for severe pre-eclampsia.  Pregnancy complicated by: Patient Active Problem List   Diagnosis Date Noted  . Elevated ALT measurement 11/25/2020  . Vaginal bleeding in pregnancy, third trimester 11/19/2020  . Chronic hypertension with superimposed preeclampsia 11/18/2020  . Gestational diabetes 10/27/2020  . Fetal growth retardation, antenatal 10/08/2020  . Chronic hypertension during pregnancy, antepartum 10/08/2020  . Elevated AFP 09/10/2020  . Rh negative state in antepartum period 08/13/2020  . Supervision of high-risk pregnancy 07/15/2020    Overnight/24hr events:  Severe range BPs overnight that needed IV meds and GERD like s/s. EFM reassuring  Subjective:  No s/s of pre-eclampsia, VB or labor Protonix and maalox helped with s/s overnight  Objective:    Current Vital Signs 24h Vital Sign Ranges  T 98.3 F (36.8 C) Temp  Avg: 98.5 F (36.9 C)  Min: 98.3 F (36.8 C)  Max: 98.9 F (37.2 C)  BP 135/70 BP  Min: 130/76  Max: 182/103  HR 70 Pulse  Avg: 66.7  Min: 56  Max: 80  RR 18 Resp  Avg: 17.7  Min: 16  Max: 20  SaO2 97 %  (Room Air) SpO2  Avg: 99.1 %  Min: 97 %  Max: 100 %       24 Hour I/O Current Shift I/O  Time Ins Outs No intake/output data recorded. No intake/output data recorded.   Patient Vitals for the past 24 hrs:  BP Temp Temp src Pulse Resp SpO2  11/27/20 0735 135/70 98.3 F (36.8 C) Oral 70 18 97 %  11/27/20 0431 139/81 98.5 F (36.9 C) Oral 70 16 100 %  11/26/20 2336 (!) 156/88 98.7 F (37.1 C) Oral (!) 56 16 100 %  11/26/20 2205 (!) 156/85 -- -- (!) 56 18 --  11/26/20 2200 -- -- -- -- -- 99 %  11/26/20 2155 -- -- -- -- -- 99 %  11/26/20 2150 -- -- -- -- -- 99 %  11/26/20 2145 -- -- -- -- -- 99 %  11/26/20 2144 (!) 161/83 -- -- 61 20 --  11/26/20 2140 -- -- -- -- -- 99 %  11/26/20 2139 (!) 181/80 -- -- 67  -- --  11/26/20 2135 -- -- -- -- -- 99 %  11/26/20 2132 (!) 181/80 -- -- 62 -- --  11/26/20 2130 -- -- -- -- -- 99 %  11/26/20 2128 (!) 182/103 -- -- 70 -- --  11/26/20 2125 (!) 156/135 -- -- 80 -- 100 %  11/26/20 1947 (!) 154/88 98.9 F (37.2 C) Oral 72 18 100 %  11/26/20 1816 (!) 156/81 -- -- 69 -- --  11/26/20 1815 (!) 157/90 -- -- 65 17 99 %  11/26/20 1540 130/76 98.4 F (36.9 C) Oral 64 18 99 %  11/26/20 1150 135/88 98.4 F (36.9 C) Oral 72 18 99 %    FHT: 135 baseline, +accels, one decel overnight that responded to position change, mod variability Toco: quiet x 43m  Physical exam: General: Well nourished, well developed female in no acute distress. Abdomen: gravid nttp Cardiovascular: S1, S2 normal, no murmur, rub or gallop, regular rate and rhythm Respiratory: CTAB Neuro: 1+ brachial  Extremities: no clubbing, cyanosis or edema Skin: Warm and dry.   Medications: Current Facility-Administered Medications  Medication Dose Route Frequency Provider Last Rate Last Admin  . calcium carbonate (TUMS - dosed in  mg elemental calcium) chewable tablet 400 mg of elemental calcium  2 tablet Oral Q4H PRN Clayton Bibles C, CNM   400 mg of elemental calcium at 11/26/20 1831  . docusate sodium (COLACE) capsule 100 mg  100 mg Oral BID PRN [place] Bing, MD   100 mg at 11/24/20 1037  . labetalol (NORMODYNE) injection 20 mg  20 mg Intravenous PRN Clayton Bibles C, CNM   20 mg at 11/26/20 2150   And  . labetalol (NORMODYNE) injection 40 mg  40 mg Intravenous PRN Clayton Bibles C, CNM   40 mg at 11/26/20 0012   And  . labetalol (NORMODYNE) injection 80 mg  80 mg Intravenous PRN Clayton Bibles C, CNM       And  . hydrALAZINE (APRESOLINE) injection 10 mg  10 mg Intravenous PRN Clayton Bibles C, CNM      . labetalol (NORMODYNE) injection 10 mg  10 mg Intravenous Q10 min PRN Dione Booze, MD   10 mg at 11/18/20 0342  . labetalol (NORMODYNE) tablet 200 mg  200 mg Oral BID  Anyanwu, Ugonna A, MD   200 mg at 11/26/20 2139  . pantoprazole (PROTONIX) EC tablet 40 mg  40 mg Oral Daily Constant, Peggy, MD   40 mg at 11/26/20 1831  . prenatal multivitamin tablet 1 tablet  1 tablet Oral Q1200 Calvert Cantor, CNM   1 tablet at 11/26/20 1247  . sodium chloride flush (NS) 0.9 % injection 3 mL  3 mL Intravenous Q12H [place] Bing, MD   3 mL at 11/26/20 2143  . zolpidem (AMBIEN) tablet 5 mg  5 mg Oral QHS PRN Calvert Cantor, [place]        Labs:  Recent Labs  Lab 11/21/20 [phone] 11/25/20 0416 11/26/20 0503  WBC 12.5* 12.5* 12.5*  HGB 11.2* 11.3* 11.2*  HCT 33.6* 34.4* 32.7*  PLT 222 189 189    Recent Labs  Lab 11/21/20 0909 11/25/20 0416 11/26/20 0503  NA 138 135 136  K 4.8 4.2 4.0  CL 106 104 104  CO2 22 22 21*  BUN 10 15 14   CREATININE 0.78 0.72 0.66  CALCIUM 8.9 9.5 9.4  PROT 6.6 6.5 6.3*  BILITOT 0.5 <0.1* 0.1*  ALKPHOS 101 120 112  ALT 38 53* 51*  AST 29 34 31  GLUCOSE 74 76 69*   Results for Rebecca Frey (MRN Jonnie Finner) as of 11/27/2020 09:04  Ref. Range 11/26/2020 09:04 11/26/2020 11:48 11/26/2020 15:37 11/26/2020 20:42 11/27/2020 05:33  Glucose-Capillary Latest Ref Range: 70 - 99 mg/dL 74 11/29/2020 (H) 81 371 (H) 76   Radiology:  2/16: BREECH, elevated UA dopplers, AFI 9, bpp 6/8 (-2 breathing) 2/11: efw 4%, 1354gm,  transverse, afi 12, bpp 8/8, elevated UA dopplers  Assessment & Plan:  Pt stable *Pregnancy: maternal fetal status reassuring. Reactive NST. Continue with qshift NSTs *Severe pre-eclampsia superimposed on cHTN: per MFM repeat BPP today. I will also repeat labs today -s/p Mg *FGR: rpt BPP and UA dopplers 2/23 *Preterm: s/p bmz on 2/8 and 2/9, NICU consult *Malpresentation: d/w her would need c/s if for delivery and still not cephalic *GDM: need meds after bmz but currently doing fine w/o meds *Rh neg: s/p rhogam on 1/21 *VB: none currently. Last on 2/8-2/9 *PPx: SCDs, OOB ad lib *FEN/GI: DM2 diet *Dispo: d/w her will  need to be inpatient for delivery and goal for 34wks and indications for earlier delivery.   07-29-2002 MD Attending [facility] ([facility]) GYN Consult Phone: [phone] (M-F, 0800-1700) & [phone] (Off hours, weekends, holidays)

## 2020-11-27 NOTE — Progress Notes (Signed)
OB Note  BPP 8/8, cephalic now, AFI 10 CMP Latest Ref Rng & Units 11/27/2020 11/26/2020 11/25/2020  Glucose 70 - 99 mg/dL 71 18(E) 76  BUN 6 - 20 mg/dL 10 14 15   Creatinine 0.44 - 1.00 mg/dL 9.93 7.16  Sodium 135 - 145 mmol/L 137 136 135  Potassium 3.5 - 5.1 mmol/L 4.3 4.0 4.2  Chloride 98 - 111 mmol/L 105 104 104  CO2 22 - 32 mmol/L 23 21(L) 22  Calcium 8.9 - 10.3 mg/dL 9.4 9.4 9.5  Total Protein 6.5 - 8.1 g/dL 6.4(L) 6.3(L) 6.5  Total Bilirubin 0.3 - 1.2 mg/dL 0.4 9.67) <0.1(L)  Alkaline Phos 38 - 126 U/L 122 112 120  AST 15 - 41 U/L 41 31 34  ALT 0 - 44 U/L 62(H) 51(H) 53(H)   CBC Latest Ref Rng & Units 11/27/2020 11/26/2020 11/25/2020  WBC 4.0 - 10.5 K/uL 11.6(H) 12.5(H) 12.5(H)  Hemoglobin 12.0 - 15.0 g/dL 11.2(L) 11.2(L) 11.3(L)  Hematocrit 36.0 - 46.0 % 32.3(L) 32.7(L) 34.4(L)  Platelets 150 - 400 K/uL 166 189 189    Patient Vitals for the past 24 hrs:  BP Temp Temp src Pulse Resp SpO2  11/27/20 0735 135/70 98.3 F (36.8 C) Oral 70 18 97 %  11/27/20 0431 139/81 98.5 F (36.9 C) Oral 70 16 100 %  11/26/20 2336 (!) 156/88 98.7 F (37.1 C) Oral (!) 56 16 100 %  11/26/20 2205 (!) 156/85 -- -- (!) 56 18 --  11/26/20 2200 -- -- -- -- -- 99 %  11/26/20 2155 -- -- -- -- -- 99 %  11/26/20 2150 -- -- -- -- -- 99 %  11/26/20 2145 -- -- -- -- -- 99 %  11/26/20 2144 (!) 161/83 -- -- 61 20 --  11/26/20 2140 -- -- -- -- -- 99 %  11/26/20 2139 (!) 181/80 -- -- 67 -- --  11/26/20 2135 -- -- -- -- -- 99 %  11/26/20 2132 (!) 181/80 -- -- 62 -- --  11/26/20 2130 -- -- -- -- -- 99 %  11/26/20 2128 (!) 182/103 -- -- 70 -- --  11/26/20 2125 (!) 156/135 -- -- 80 -- 100 %  11/26/20 1947 (!) 154/88 98.9 F (37.2 C) Oral 72 18 100 %  11/26/20 1816 (!) 156/81 -- -- 69 -- --  11/26/20 1815 (!) 157/90 -- -- 65 17 99 %  11/26/20 1540 130/76 98.4 F (36.9 C) Oral 64 18 99 %    Patient asymptomatic but AST up again today. She also needed IV meds again last night.  Yesterday, MFM (Dr.  11/28/20) recommended delivery for any worsening disease, so I told Rebecca Frey we recommend proceeding with delivery today given increasing risk to both her and baby.  She is cephalic and baby has looked fine on EFM so trying for vaginal delivery via IOL is what I would recommend.    Patient would like to talk to family about proceeding with delivery. NICU aware  Parke Poisson MD Attending [facility]) 11/27/2020 Time: 1200pm

## 2020-11-27 NOTE — Progress Notes (Signed)
OB Note Patient desires to proceed with IOL. L&D aware  [facility] MD Attending Center for [facility] (Faculty Practice) 11/27/2020 Time: 1300

## 2020-11-27 NOTE — Progress Notes (Signed)
Patient Vitals for the past 4 hrs:  BP Temp Temp src Pulse Resp SpO2  11/27/20 1532 132/75 -- -- 67 -- --  11/27/20 1355 -- -- -- -- -- 98 %  11/27/20 1350 -- -- -- -- -- 98 %  11/27/20 1345 -- -- -- -- -- 98 %  11/27/20 1344 129/78 98.6 F (37 C) Axillary 82 20 98 %  11/27/20 1308 135/76 98.4 F (36.9 C) Oral 87 18 98 %   MgSO4 at 4gm/hr  CBG normal. FHR Cat 1.  Cx FT/long/ballottable/vtx.  Attempted foley, wouldn't thread through inner os. Cytotec placed in post vaginal fornix. Will continue cytotec q 4 hours/ attempt foley once cx dilates a little more. Will be mindful of unstable lie.

## 2020-11-27 NOTE — Progress Notes (Signed)
Pt's FHR tracing starting at 1415. FHR tracing not saving . pts chart updated. FHR tracing at 1431. Magnesium started 1420. BP set to check every 10 minutes. No BPs in severe range during this time.

## 2020-11-27 NOTE — Progress Notes (Signed)
Patient Vitals for the past 4 hrs:  BP Temp Temp src Pulse  11/27/20 2100 137/73 -- -- 83  11/27/20 2000 (!) 148/88 98.9 F (37.2 C) Oral 83  11/27/20 1802 130/65 -- -- 80   No real change in cx.  Baby still vtx.  Abdominal binder placed, along w/2nd cytotec. FHR Cat 1.

## 2020-11-28 ENCOUNTER — Encounter (HOSPITAL_COMMUNITY)
Admission: EM | Disposition: A | Discharge status: Home / Self Care | Payer: Self-pay | Source: Home / Self Care | Attending: Obstetrics & Gynecology

## 2020-11-28 ENCOUNTER — Inpatient Hospital Stay (HOSPITAL_COMMUNITY): Payer: Medicaid Other | Admitting: Anesthesiology

## 2020-11-28 ENCOUNTER — Encounter (HOSPITAL_COMMUNITY): Payer: Self-pay | Admitting: Obstetrics & Gynecology

## 2020-11-28 DIAGNOSIS — Z6791 Unspecified blood type, Rh negative: Secondary | ICD-10-CM | POA: Diagnosis not present

## 2020-11-28 DIAGNOSIS — O26893 Other specified pregnancy related conditions, third trimester: Secondary | ICD-10-CM | POA: Diagnosis not present

## 2020-11-28 DIAGNOSIS — O1092 Unspecified pre-existing hypertension complicating childbirth: Secondary | ICD-10-CM | POA: Diagnosis not present

## 2020-11-28 DIAGNOSIS — O114 Pre-existing hypertension with pre-eclampsia, complicating childbirth: Secondary | ICD-10-CM | POA: Diagnosis not present

## 2020-11-28 DIAGNOSIS — I517 Cardiomegaly: Secondary | ICD-10-CM | POA: Diagnosis not present

## 2020-11-28 DIAGNOSIS — O365930 Maternal care for other known or suspected poor fetal growth, third trimester, not applicable or unspecified: Secondary | ICD-10-CM | POA: Diagnosis not present

## 2020-11-28 DIAGNOSIS — O9902 Anemia complicating childbirth: Secondary | ICD-10-CM | POA: Diagnosis not present

## 2020-11-28 DIAGNOSIS — Z3A32 32 weeks gestation of pregnancy: Secondary | ICD-10-CM | POA: Diagnosis not present

## 2020-11-28 DIAGNOSIS — O1002 Pre-existing essential hypertension complicating childbirth: Secondary | ICD-10-CM | POA: Diagnosis not present

## 2020-11-28 DIAGNOSIS — O24420 Gestational diabetes mellitus in childbirth, diet controlled: Secondary | ICD-10-CM | POA: Diagnosis not present

## 2020-11-28 DIAGNOSIS — O368330 Maternal care for abnormalities of the fetal heart rate or rhythm, third trimester, not applicable or unspecified: Secondary | ICD-10-CM | POA: Diagnosis not present

## 2020-11-28 DIAGNOSIS — O320XX0 Maternal care for unstable lie, not applicable or unspecified: Secondary | ICD-10-CM | POA: Diagnosis not present

## 2020-11-28 DIAGNOSIS — O99214 Obesity complicating childbirth: Secondary | ICD-10-CM | POA: Diagnosis not present

## 2020-11-28 DIAGNOSIS — R918 Other nonspecific abnormal finding of lung field: Secondary | ICD-10-CM | POA: Diagnosis not present

## 2020-11-28 DIAGNOSIS — O24429 Gestational diabetes mellitus in childbirth, unspecified control: Secondary | ICD-10-CM | POA: Diagnosis not present

## 2020-11-28 DIAGNOSIS — Z20822 Contact with and (suspected) exposure to covid-19: Secondary | ICD-10-CM | POA: Diagnosis not present

## 2020-11-28 DIAGNOSIS — O4593 Premature separation of placenta, unspecified, third trimester: Secondary | ICD-10-CM | POA: Diagnosis not present

## 2020-11-28 LAB — CBC
HCT: 34.6 % — ABNORMAL LOW (ref 36.0–46.0)
HCT: 26.9 % — ABNORMAL LOW (ref 36.0–46.0)
HCT: 26.4 % — ABNORMAL LOW (ref 36.0–46.0)
Hemoglobin: 11.5 g/dL — ABNORMAL LOW (ref 12.0–15.0)
Hemoglobin: 9.4 g/dL — ABNORMAL LOW (ref 12.0–15.0)
Hemoglobin: 9 g/dL — ABNORMAL LOW (ref 12.0–15.0)
MCH: 29.3 pg (ref 26.0–34.0)
MCH: 30.9 pg (ref 26.0–34.0)
MCH: 29.9 pg (ref 26.0–34.0)
MCHC: 33.2 g/dL (ref 30.0–36.0)
MCHC: 34.9 g/dL (ref 30.0–36.0)
MCHC: 34.1 g/dL (ref 30.0–36.0)
MCV: 88.3 fL (ref 80.0–100.0)
MCV: 88.5 fL (ref 80.0–100.0)
MCV: 87.7 fL (ref 80.0–100.0)
Platelets: 141 10*3/uL — ABNORMAL LOW (ref 150–400)
Platelets: 110 10*3/uL — ABNORMAL LOW (ref 150–400)
Platelets: 124 10*3/uL — ABNORMAL LOW (ref 150–400)
RBC: 3.92 MIL/uL (ref 3.87–5.11)
RBC: 3.04 MIL/uL — ABNORMAL LOW (ref 3.87–5.11)
RBC: 3.01 MIL/uL — ABNORMAL LOW (ref 3.87–5.11)
RDW: 15.5 % (ref 11.5–15.5)
RDW: 15.3 % (ref 11.5–15.5)
RDW: 15.5 % (ref 11.5–15.5)
WBC: 13.9 10*3/uL — ABNORMAL HIGH (ref 4.0–10.5)
WBC: 12.5 10*3/uL — ABNORMAL HIGH (ref 4.0–10.5)
WBC: 14.4 10*3/uL — ABNORMAL HIGH (ref 4.0–10.5)
nRBC: 0 % (ref 0.0–0.2)
nRBC: 0 % (ref 0.0–0.2)
nRBC: 0 % (ref 0.0–0.2)

## 2020-11-28 LAB — COMPREHENSIVE METABOLIC PANEL
ALT: 118 U/L — ABNORMAL HIGH (ref 0–44)
ALT: 80 U/L — ABNORMAL HIGH (ref 0–44)
AST: 103 U/L — ABNORMAL HIGH (ref 15–41)
AST: 59 U/L — ABNORMAL HIGH (ref 15–41)
Albumin: 2.6 g/dL — ABNORMAL LOW (ref 3.5–5.0)
Albumin: 2.1 g/dL — ABNORMAL LOW (ref 3.5–5.0)
Alkaline Phosphatase: 134 U/L — ABNORMAL HIGH (ref 38–126)
Alkaline Phosphatase: 105 U/L (ref 38–126)
Anion gap: 8 (ref 5–15)
Anion gap: 6 (ref 5–15)
BUN: 9 mg/dL (ref 6–20)
BUN: 7 mg/dL (ref 6–20)
CO2: 21 mmol/L — ABNORMAL LOW (ref 22–32)
CO2: 23 mmol/L (ref 22–32)
Calcium: 7.5 mg/dL — ABNORMAL LOW (ref 8.9–10.3)
Calcium: 6.6 mg/dL — ABNORMAL LOW (ref 8.9–10.3)
Chloride: 101 mmol/L (ref 98–111)
Chloride: 102 mmol/L (ref 98–111)
Creatinine, Ser: 0.72 mg/dL (ref 0.44–1.00)
Creatinine, Ser: 0.83 mg/dL (ref 0.44–1.00)
GFR, Estimated: >60 mL/min (ref >60)
GFR, Estimated: >60 mL/min (ref >60)
Glucose, Bld: 89 mg/dL (ref 70–99)
Glucose, Bld: 101 mg/dL — ABNORMAL HIGH (ref 70–99)
Potassium: 4 mmol/L (ref 3.5–5.1)
Potassium: 4.1 mmol/L (ref 3.5–5.1)
Sodium: 130 mmol/L — ABNORMAL LOW (ref 135–145)
Sodium: 131 mmol/L — ABNORMAL LOW (ref 135–145)
Total Bilirubin: 1 mg/dL (ref 0.3–1.2)
Total Bilirubin: 0.8 mg/dL (ref 0.3–1.2)
Total Protein: 6.6 g/dL (ref 6.5–8.1)
Total Protein: 5.6 g/dL — ABNORMAL LOW (ref 6.5–8.1)

## 2020-11-28 LAB — GLUCOSE, CAPILLARY
Glucose-Capillary: 102 mg/dL — ABNORMAL HIGH (ref 70–99)
Glucose-Capillary: 106 mg/dL — ABNORMAL HIGH (ref 70–99)
Glucose-Capillary: 87 mg/dL (ref 70–99)
Glucose-Capillary: 95 mg/dL (ref 70–99)

## 2020-11-28 SURGERY — Surgical Case
Anesthesia: Epidural

## 2020-11-28 MED ORDER — SIMETHICONE 80 MG PO CHEW: 80.0000 mg | CHEWABLE_TABLET | ORAL | Status: DC | PRN

## 2020-11-28 MED ORDER — DIBUCAINE (PERIANAL) 1 % EX OINT: 1.0000 "application " | TOPICAL_OINTMENT | CUTANEOUS | Status: DC | PRN

## 2020-11-28 MED ORDER — NALBUPHINE HCL 10 MG/ML IJ SOLN: 5.0000 mg | INTRAMUSCULAR | Status: DC | PRN

## 2020-11-28 MED ORDER — MORPHINE SULFATE (PF) 0.5 MG/ML IJ SOLN
INTRAMUSCULAR | Status: AC
  Filled 2020-11-28: qty 10

## 2020-11-28 MED ORDER — ENOXAPARIN SODIUM 60 MG/0.6ML ~~LOC~~ SOLN
60.0000 mg | SUBCUTANEOUS | Status: DC
  Administered 2020-11-29 – 2020-12-01 (×3): 60 mg via SUBCUTANEOUS
  Filled 2020-11-28 (×3): qty 0.6

## 2020-11-28 MED ORDER — HYDROMORPHONE HCL 1 MG/ML IJ SOLN
INTRAMUSCULAR | Status: AC
  Filled 2020-11-28: qty 0.5

## 2020-11-28 MED ORDER — EPHEDRINE 5 MG/ML INJ: 10.0000 mg | INTRAVENOUS | Status: DC | PRN

## 2020-11-28 MED ORDER — PHENYLEPHRINE 40 MCG/ML (10ML) SYRINGE FOR IV PUSH (FOR BLOOD PRESSURE SUPPORT)
PREFILLED_SYRINGE | INTRAVENOUS | Status: AC
  Filled 2020-11-28: qty 10

## 2020-11-28 MED ORDER — OXYCODONE HCL 5 MG PO TABS: 5.0000 mg | ORAL_TABLET | Freq: Once | ORAL | Status: DC | PRN

## 2020-11-28 MED ORDER — PROMETHAZINE HCL 25 MG/ML IJ SOLN: 6.2500 mg | INTRAMUSCULAR | Status: DC | PRN

## 2020-11-28 MED ORDER — KETOROLAC TROMETHAMINE 30 MG/ML IJ SOLN
30.0000 mg | Freq: Four times a day (QID) | INTRAMUSCULAR | Status: AC | PRN
  Administered 2020-11-28 – 2020-11-29 (×2): 30 mg via INTRAVENOUS
  Filled 2020-11-28: qty 1

## 2020-11-28 MED ORDER — KETOROLAC TROMETHAMINE 30 MG/ML IJ SOLN: 30.0000 mg | Freq: Once | INTRAMUSCULAR | Status: AC | PRN

## 2020-11-28 MED ORDER — WITCH HAZEL-GLYCERIN EX PADS: 1.0000 "application " | MEDICATED_PAD | CUTANEOUS | Status: DC | PRN

## 2020-11-28 MED ORDER — ACETAMINOPHEN 500 MG PO TABS
1000.0000 mg | ORAL_TABLET | Freq: Four times a day (QID) | ORAL | Status: AC
  Administered 2020-11-28 – 2020-11-29 (×3): 1000 mg via ORAL
  Filled 2020-11-28 (×3): qty 2

## 2020-11-28 MED ORDER — NALBUPHINE HCL 10 MG/ML IJ SOLN: 5.0000 mg | Freq: Once | INTRAMUSCULAR | Status: DC | PRN

## 2020-11-28 MED ORDER — SCOPOLAMINE 1 MG/3DAYS TD PT72
1.0000 | MEDICATED_PATCH | Freq: Once | TRANSDERMAL | Status: AC
  Administered 2020-11-28: 1.5 mg via TRANSDERMAL

## 2020-11-28 MED ORDER — KETOROLAC TROMETHAMINE 30 MG/ML IJ SOLN
INTRAMUSCULAR | Status: AC
  Filled 2020-11-28: qty 1

## 2020-11-28 MED ORDER — OXYCODONE HCL 5 MG PO TABS
5.0000 mg | ORAL_TABLET | ORAL | Status: DC | PRN
Start: 2020-11-28 — End: 2020-12-01
  Administered 2020-11-29 – 2020-11-30 (×2): 5 mg via ORAL
  Filled 2020-11-28 (×2): qty 1

## 2020-11-28 MED ORDER — PRENATAL MULTIVITAMIN CH
1.0000 | ORAL_TABLET | Freq: Every day | ORAL | Status: DC
  Administered 2020-11-30 – 2020-12-01 (×2): 1 via ORAL
  Filled 2020-11-28 (×4): qty 1

## 2020-11-28 MED ORDER — KETOROLAC TROMETHAMINE 30 MG/ML IJ SOLN: 30.0000 mg | Freq: Four times a day (QID) | INTRAMUSCULAR | Status: AC | PRN

## 2020-11-28 MED ORDER — MAGNESIUM SULFATE 40 GM/1000ML IV SOLN
2.0000 g/h | INTRAVENOUS | Status: AC
  Administered 2020-11-29: 2 g/h via INTRAVENOUS
  Filled 2020-11-28: qty 1000

## 2020-11-28 MED ORDER — ZOLPIDEM TARTRATE 5 MG PO TABS: 5.0000 mg | ORAL_TABLET | Freq: Every evening | ORAL | Status: DC | PRN

## 2020-11-28 MED ORDER — OXYCODONE HCL 5 MG/5ML PO SOLN: 5.0000 mg | Freq: Once | ORAL | Status: DC | PRN

## 2020-11-28 MED ORDER — CEFAZOLIN SODIUM-DEXTROSE 2-3 GM-%(50ML) IV SOLR
INTRAVENOUS | Status: DC | PRN
  Administered 2020-11-28: 2 g via INTRAVENOUS

## 2020-11-28 MED ORDER — PHENYLEPHRINE 40 MCG/ML (10ML) SYRINGE FOR IV PUSH (FOR BLOOD PRESSURE SUPPORT)
80.0000 ug | PREFILLED_SYRINGE | INTRAVENOUS | Status: AC | PRN
  Administered 2020-11-28 (×3): 80 ug via INTRAVENOUS

## 2020-11-28 MED ORDER — LACTATED RINGERS IV SOLN
500.0000 mL | Freq: Once | INTRAVENOUS | Status: AC
  Administered 2020-11-28: 250 mL via INTRAVENOUS

## 2020-11-28 MED ORDER — PHENYLEPHRINE HCL (PRESSORS) 10 MG/ML IV SOLN
INTRAVENOUS | Status: DC | PRN
  Administered 2020-11-28 (×2): 80 ug via INTRAVENOUS
  Administered 2020-11-28 (×5): 40 ug via INTRAVENOUS

## 2020-11-28 MED ORDER — MISOPROSTOL 50MCG HALF TABLET
50.0000 ug | ORAL_TABLET | ORAL | Status: AC
  Administered 2020-11-28: 50 ug via ORAL
  Filled 2020-11-28: qty 1

## 2020-11-28 MED ORDER — SODIUM CHLORIDE 0.9% FLUSH: 3.0000 mL | INTRAVENOUS | Status: DC | PRN

## 2020-11-28 MED ORDER — MENTHOL 3 MG MT LOZG: 1.0000 | LOZENGE | OROMUCOSAL | Status: DC | PRN

## 2020-11-28 MED ORDER — ONDANSETRON HCL 4 MG/2ML IJ SOLN
INTRAMUSCULAR | Status: AC
  Filled 2020-11-28: qty 2

## 2020-11-28 MED ORDER — DIPHENHYDRAMINE HCL 25 MG PO CAPS
25.0000 mg | ORAL_CAPSULE | Freq: Four times a day (QID) | ORAL | Status: DC | PRN
Start: 2020-11-28 — End: 2020-12-01

## 2020-11-28 MED ORDER — HYDROMORPHONE HCL 1 MG/ML IJ SOLN
0.2500 mg | INTRAMUSCULAR | Status: DC | PRN
  Administered 2020-11-28: 0.25 mg via INTRAVENOUS

## 2020-11-28 MED ORDER — SIMETHICONE 80 MG PO CHEW
80.0000 mg | CHEWABLE_TABLET | Freq: Three times a day (TID) | ORAL | Status: DC
  Administered 2020-11-28 – 2020-12-01 (×7): 80 mg via ORAL
  Filled 2020-11-28 (×9): qty 1

## 2020-11-28 MED ORDER — ONDANSETRON HCL 4 MG/2ML IJ SOLN: 4.0000 mg | Freq: Three times a day (TID) | INTRAMUSCULAR | Status: DC | PRN

## 2020-11-28 MED ORDER — NALOXONE HCL 4 MG/10ML IJ SOLN
1.0000 ug/kg/h | INTRAVENOUS | Status: DC | PRN
  Filled 2020-11-28: qty 5

## 2020-11-28 MED ORDER — FENTANYL-BUPIVACAINE-NACL 0.5-0.125-0.9 MG/250ML-% EP SOLN
EPIDURAL | Status: DC | PRN
  Administered 2020-11-28: 12 mL/h via EPIDURAL

## 2020-11-28 MED ORDER — SODIUM CHLORIDE 0.9 % IV SOLN
INTRAVENOUS | Status: AC
  Filled 2020-11-28: qty 500

## 2020-11-28 MED ORDER — PHENYLEPHRINE 40 MCG/ML (10ML) SYRINGE FOR IV PUSH (FOR BLOOD PRESSURE SUPPORT)
80.0000 ug | PREFILLED_SYRINGE | INTRAVENOUS | Status: DC | PRN
  Filled 2020-11-28: qty 10

## 2020-11-28 MED ORDER — DIPHENHYDRAMINE HCL 25 MG PO CAPS: 25.0000 mg | ORAL_CAPSULE | ORAL | Status: DC | PRN

## 2020-11-28 MED ORDER — NALOXONE HCL 0.4 MG/ML IJ SOLN: 0.4000 mg | INTRAMUSCULAR | Status: DC | PRN

## 2020-11-28 MED ORDER — SODIUM CHLORIDE 0.9 % IV SOLN
INTRAVENOUS | Status: DC | PRN
  Administered 2020-11-28: 500 mg via INTRAVENOUS

## 2020-11-28 MED ORDER — LIDOCAINE-EPINEPHRINE (PF) 2 %-1:200000 IJ SOLN
INTRAMUSCULAR | Status: DC | PRN
  Administered 2020-11-28: 10 mL via EPIDURAL

## 2020-11-28 MED ORDER — OXYTOCIN-SODIUM CHLORIDE 30-0.9 UT/500ML-% IV SOLN
INTRAVENOUS | Status: AC
  Filled 2020-11-28: qty 500

## 2020-11-28 MED ORDER — OXYTOCIN-SODIUM CHLORIDE 30-0.9 UT/500ML-% IV SOLN
1.0000 m[IU]/min | INTRAVENOUS | Status: DC
  Administered 2020-11-28: 2 m[IU]/min via INTRAVENOUS
  Filled 2020-11-28: qty 500

## 2020-11-28 MED ORDER — MISOPROSTOL 50MCG HALF TABLET
50.0000 ug | ORAL_TABLET | ORAL | Status: DC
  Administered 2020-11-28: 50 ug via BUCCAL
  Filled 2020-11-28: qty 1

## 2020-11-28 MED ORDER — DIPHENHYDRAMINE HCL 50 MG/ML IJ SOLN: 12.5000 mg | INTRAMUSCULAR | Status: DC | PRN

## 2020-11-28 MED ORDER — TERBUTALINE SULFATE 1 MG/ML IJ SOLN: 0.2500 mg | Freq: Once | INTRAMUSCULAR | Status: DC | PRN

## 2020-11-28 MED ORDER — OXYTOCIN-SODIUM CHLORIDE 30-0.9 UT/500ML-% IV SOLN
INTRAVENOUS | Status: DC | PRN
  Administered 2020-11-28: 300 mL via INTRAVENOUS

## 2020-11-28 MED ORDER — CEFAZOLIN SODIUM-DEXTROSE 2-4 GM/100ML-% IV SOLN
INTRAVENOUS | Status: AC
  Filled 2020-11-28: qty 100

## 2020-11-28 MED ORDER — LACTATED RINGERS IV SOLN: INTRAVENOUS | Status: DC

## 2020-11-28 MED ORDER — SCOPOLAMINE 1 MG/3DAYS TD PT72
MEDICATED_PATCH | TRANSDERMAL | Status: AC
  Filled 2020-11-28: qty 1

## 2020-11-28 MED ORDER — FENTANYL-BUPIVACAINE-NACL 0.5-0.125-0.9 MG/250ML-% EP SOLN
12.0000 mL/h | EPIDURAL | Status: DC | PRN
  Filled 2020-11-28: qty 250

## 2020-11-28 MED ORDER — IBUPROFEN 600 MG PO TABS
600.0000 mg | ORAL_TABLET | Freq: Four times a day (QID) | ORAL | Status: DC | PRN
  Administered 2020-11-29 – 2020-11-30 (×2): 600 mg via ORAL
  Filled 2020-11-28 (×2): qty 1

## 2020-11-28 MED ORDER — OXYTOCIN-SODIUM CHLORIDE 30-0.9 UT/500ML-% IV SOLN: 2.5000 [IU]/h | INTRAVENOUS | Status: AC

## 2020-11-28 MED ORDER — MEPERIDINE HCL 25 MG/ML IJ SOLN: 6.2500 mg | INTRAMUSCULAR | Status: DC | PRN

## 2020-11-28 MED ORDER — MAGNESIUM SULFATE 40 GM/1000ML IV SOLN
INTRAVENOUS | Status: AC
  Filled 2020-11-28: qty 1000

## 2020-11-28 MED ORDER — EPHEDRINE 5 MG/ML INJ
10.0000 mg | INTRAVENOUS | Status: DC | PRN
  Filled 2020-11-28: qty 10

## 2020-11-28 MED ORDER — TETANUS-DIPHTH-ACELL PERTUSSIS 5-2.5-18.5 LF-MCG/0.5 IM SUSY: 0.5000 mL | PREFILLED_SYRINGE | Freq: Once | INTRAMUSCULAR | Status: DC

## 2020-11-28 MED ORDER — ACETAMINOPHEN 325 MG PO TABS
650.0000 mg | ORAL_TABLET | ORAL | Status: DC | PRN
  Administered 2020-12-01: 650 mg via ORAL
  Filled 2020-11-28: qty 2

## 2020-11-28 MED ORDER — ONDANSETRON HCL 4 MG/2ML IJ SOLN
INTRAMUSCULAR | Status: DC | PRN
  Administered 2020-11-28: 4 mg via INTRAVENOUS

## 2020-11-28 MED ORDER — MORPHINE SULFATE (PF) 0.5 MG/ML IJ SOLN
INTRAMUSCULAR | Status: DC | PRN
  Administered 2020-11-28: 3 mg via EPIDURAL

## 2020-11-28 MED ORDER — COCONUT OIL OIL: 1.0000 "application " | TOPICAL_OIL | Status: DC | PRN

## 2020-11-28 MED ORDER — LIDOCAINE HCL (PF) 1 % IJ SOLN
INTRAMUSCULAR | Status: DC | PRN
  Administered 2020-11-28: 2 mL via EPIDURAL
  Administered 2020-11-28: 10 mL via EPIDURAL

## 2020-11-28 MED ORDER — SENNOSIDES-DOCUSATE SODIUM 8.6-50 MG PO TABS
2.0000 | ORAL_TABLET | ORAL | Status: DC
  Administered 2020-11-29 – 2020-11-30 (×2): 2 via ORAL
  Filled 2020-11-28 (×3): qty 2

## 2020-11-28 MED ORDER — SODIUM CHLORIDE 0.9 % IR SOLN
Status: DC | PRN
  Administered 2020-11-28: 1

## 2020-11-28 SURGICAL SUPPLY — 33 items
BENZOIN TINCTURE PRP APPL 2/3 (GAUZE/BANDAGES/DRESSINGS) ×2 IMPLANT
CHLORAPREP W/TINT 26ML (MISCELLANEOUS) ×2 IMPLANT
CLAMP CORD UMBIL (MISCELLANEOUS) IMPLANT
CLOTH BEACON ORANGE TIMEOUT ST (SAFETY) ×2 IMPLANT
DRSG OPSITE POSTOP 4X10 (GAUZE/BANDAGES/DRESSINGS) ×2 IMPLANT
ELECT REM PT RETURN 9FT ADLT (ELECTROSURGICAL) ×2
ELECTRODE REM PT RTRN 9FT ADLT (ELECTROSURGICAL) ×1 IMPLANT
EXTRACTOR VACUUM M CUP 4 TUBE (SUCTIONS) IMPLANT
GLOVE BIOGEL PI IND STRL 7.0 (GLOVE) ×2 IMPLANT
GLOVE BIOGEL PI IND STRL 7.5 (GLOVE) ×2 IMPLANT
GLOVE BIOGEL PI INDICATOR 7.0 (GLOVE) ×2
GLOVE BIOGEL PI INDICATOR 7.5 (GLOVE) ×2
GLOVE ECLIPSE 7.5 STRL STRAW (GLOVE) ×2 IMPLANT
GOWN STRL REUS W/TWL LRG LVL3 (GOWN DISPOSABLE) ×6 IMPLANT
KIT ABG SYR 3ML LUER SLIP (SYRINGE) IMPLANT
NEEDLE HYPO 25X5/8 SAFETYGLIDE (NEEDLE) IMPLANT
NS IRRIG 1000ML POUR BTL (IV SOLUTION) ×2 IMPLANT
PACK C SECTION WH (CUSTOM PROCEDURE TRAY) ×2 IMPLANT
PAD OB MATERNITY 4.3X12.25 (PERSONAL CARE ITEMS) ×2 IMPLANT
PENCIL SMOKE EVAC W/HOLSTER (ELECTROSURGICAL) ×2 IMPLANT
RTRCTR C-SECT PINK 25CM LRG (MISCELLANEOUS) ×2 IMPLANT
STRIP CLOSURE SKIN 1/2X4 (GAUZE/BANDAGES/DRESSINGS) ×2 IMPLANT
STRIP CLOSURE SKIN 1/4X3 (GAUZE/BANDAGES/DRESSINGS) ×2 IMPLANT
SUT PLAIN 2 0 XLH (SUTURE) ×2 IMPLANT
SUT VIC AB 0 CT1 36 (SUTURE) ×2 IMPLANT
SUT VIC AB 0 CTX 36 (SUTURE) ×4
SUT VIC AB 0 CTX36XBRD ANBCTRL (SUTURE) ×4 IMPLANT
SUT VIC AB 2-0 CT1 27 (SUTURE) ×1
SUT VIC AB 2-0 CT1 TAPERPNT 27 (SUTURE) ×1 IMPLANT
SUT VIC AB 4-0 KS 27 (SUTURE) ×2 IMPLANT
TOWEL OR 17X24 6PK STRL BLUE (TOWEL DISPOSABLE) ×2 IMPLANT
TRAY FOLEY W/BAG SLVR 14FR LF (SET/KITS/TRAYS/PACK) ×2 IMPLANT
WATER STERILE IRR 1000ML POUR (IV SOLUTION) ×2 IMPLANT

## 2020-11-28 NOTE — Op Note (Addendum)
Cesarean Section Operative Report  Rebecca Frey  11/18/2020 - 11/28/2020  Indications: fetal bradycardia, abruption  Pre-operative Diagnosis: fetal indications  Post-operative Diagnosis: fetal bradycardia, abruption  Surgeon: Surgeon(s) and Role:    * Caoilainn Sacks, Wilfred Curtis, MD - Primary    * [place] Bing, MD - Assisting   Attending Attestation: I was present and scrubbed for the entire procedure.   Anesthesia: epidural    Estimated Blood Loss: 263 ml  Total IV Fluids: 1600 ml LR and 300 ml pitocin  Urine Output:: 350 ml clear yeloow urine  Specimens: placenta to pathology  Findings: Viable female infant in cephalic presentation; Apgars pending; weight pending g; arterial cord pH pending; cleary amniotic fluid; abrupted intact placenta with three vessel cord; normal uterus, fallopian tubes and ovaries bilaterally.  Baby condition / location:  NICU   Complications: no complications  Indications: [name] is a 30 y.o. G1P0 with an IUP [redacted]w[redacted]d presenting for (Ol for chtn with superimposed severe preeclampsia. STAT c/s for fetal indications (bradycardia 2/2 placental abruption). See progress note for details.   Due to the emergent nature of the surgery, brief verbal informed consent was obtained.  Procedure Details:  The patient was taken back to the operative suite where epidural anesthesia was dosed. Ancef 2 g and azithromycin 500 mg IV were ordered.  A time out was held and the above information confirmed.   After induction of anesthesia, the patient was draped and prepped with iodine and placed in a dorsal supine position with a leftward tilt. A Pfannenstiel incision was made and carried down through the subcutaneous tissue to the fascia. Fascial incision was made and bluntly extended transversely. The fascia was separated from the underlying rectus tissue superiorly and inferiorly. The peritoneum was identified and bluntly entered and extended longitudinally. Bladder  blade was placed. A low transverse uterine incision was made and extended bluntly. Immediately upon entering the uterine cavity, non-adherent placenta with clot was encountered. Delivered from cephalic presentation was a viable infant with Apgars and weight as above. At this time Dr. Vergie Living arrived and scrubbed in to assist. Infant tone poor and weak respirations noted. Cord then promptly clamped and cut and cord blood was obtained for evaluation. Cord ph was sent. The placenta was removed Intact and appeared small. The uterine outline, tubes and ovaries appeared normal. The uterine incision was closed with running locked sutures of 0Vicryl with an imbricating layer of the same.   Hemostasis was observed after placement of additional running locked sutures and one figure of 8 suture, both with 0 vicryl. The peritoneum was closed with 2-0 vicryl. The rectus muscles were examined and hemostasis observed. The fascia was then reapproximated with running sutures of 0Vicryl. The subcuticular closure was performed using 2-0plain gut. The skin was closed with 4-0Vicryl.   Instrument, sponge, and needle counts were correct prior the abdominal closure and were correct at the conclusion of the case.     Disposition: PACU - hemodynamically stable.   Maternal Condition: stable       Signed: Cherrie Gauze WoukMD 11/28/2020 1:12 PM

## 2020-11-28 NOTE — Anesthesia Procedure Notes (Signed)
Epidural Patient location during procedure: OB Start time: 11/28/2020 11:16 AM End time: 11/28/2020 11:25 AM  Staffing Anesthesiologist: Lannie Fields, DO Performed: anesthesiologist   Preanesthetic Checklist Completed: patient identified, IV checked, risks and benefits discussed, monitors and equipment checked, pre-op evaluation and timeout performed  Epidural Patient position: sitting Prep: DuraPrep and site prepped and draped Patient monitoring: continuous pulse ox, blood pressure, heart rate and cardiac monitor Approach: midline Location: L3-L4 Injection technique: LOR air  Needle:  Needle type: Tuohy  Needle gauge: 17 G Needle length: 9 cm Needle insertion depth: 7 cm Catheter type: closed end flexible Catheter size: 19 Gauge Catheter at skin depth: 12 cm Test dose: negative  Assessment Sensory level: T8 Events: blood not aspirated, injection not painful, no injection resistance, no paresthesia and negative IV test  Additional Notes Patient identified. Risks/Benefits/Options discussed with patient including but not limited to bleeding, infection, nerve damage, paralysis, failed block, incomplete pain control, headache, blood pressure changes, nausea, vomiting, reactions to medication both or allergic, itching and postpartum back pain. Confirmed with bedside nurse the patient's most recent platelet count. Confirmed with patient that they are not currently taking any anticoagulation, have any bleeding history or any family history of bleeding disorders. Patient expressed understanding and wished to proceed. All questions were answered. Sterile technique was used throughout the entire procedure. Please see nursing notes for vital signs. Test dose was given through epidural catheter and negative prior to continuing to dose epidural or start infusion. Warning signs of high block given to the patient including shortness of breath, tingling/numbness in hands, complete motor  block, or any concerning symptoms with instructions to call for help. Patient was given instructions on fall risk and not to get out of bed. All questions and concerns addressed with instructions to call with any issues or inadequate analgesia.  Reason for block:procedure for pain

## 2020-11-28 NOTE — Progress Notes (Signed)
Rebecca Frey is a 30 y.o. G1P0 at [redacted]w[redacted]d admitted for IOL for cHTN with SI pre-E with SF.   Subjective: FB still in place.  Strip reviewed.   Objective: BP 138/80   Pulse 71   Temp 98.2 F (36.8 C) (Oral)   Resp 18   Ht 5\' 4"  (1.626 m)   Wt 121.6 kg   LMP 03/19/2020   SpO2 98%   BMI 46.02 kg/m  Total I/O In: -  Out: 975 [Urine:975]  FHT: FHR: 130 bpm, variability: moderate, accelerations: Absent, decelerations: Absent UC: None  SVE:   Dilation: Fingertip Effacement (%): Thick Station: Ballotable Exam by:: [email or web address]  Labs: Lab Results  Component Value Date   WBC 14.5 (H) 11/27/2020   HGB 11.5 (L) 11/27/2020   HCT 32.8 (L) 11/27/2020   MCV 87.9 11/27/2020   PLT 160 11/27/2020    Assessment / Plan: Rebecca Frey is a 30 y.o. G1P0 at [redacted]w[redacted]d admitted for IOL for cHTN with SI pre-E with SF.   #Labor: S/p Cytotec x3.  FB in place.  Give Cytotec 50 mcg buccally for 4th dose.  #cHTN with SI pre-E with SF: On Mg. Continue labetalol 200 mg BID.  Antihypertensive regimen PRN severe BP. #Pain: Per patient request  #FWB: Cat I #ID: PCN for GBS prophylaxis in s/o prematurity #MOF: Breast #MOC: Condoms #Circ: Yes  Rebecca Frey [redacted]w[redacted]d, MD 11/28/2020, 4:48 AM

## 2020-11-28 NOTE — Progress Notes (Signed)
Labor Progress Note Rebecca Frey is a 30 y.o. G1P0 at [redacted]w[redacted]d presented for PEC with SF  S:  Feeling some ctx. Reports mild HA, rates 1-2/10. Denies visual disturbances, RUQ pain, SOB, and CP.  O:  BP 134/74   Pulse 70   Temp 98.1 F (36.7 C) (Oral)   Resp 20   Ht 5\' 4"  (1.626 m)   Wt 121.6 kg   LMP 03/19/2020   SpO2 98%   BMI 46.02 kg/m  EFM: baseline 125 bpm/ mod variability/ no accels/ no decels  Toco/IUPC: q3 per palpation SVE: Dilation: 5 Effacement (%): 70 Cervical Position: Posterior Station: Ballotable Presentation: Vertex Exam by:: [email or web address], CNM  A/P: 30 y.o. G1P0 [redacted]w[redacted]d  1. Labor: latent 2. FWB: Cat I 3. Pain: analgesia/anesthesia prn 4. PEC: BP stable; LFTs trending up and Plt trending down- rpt labs @1700 , continue Mg 5. GDM: stable  S/p FB and Cytotec. SROM for clear/bloody fluid. Attempted to place IUPC but forebag palpated, AROM of forebag, clear fluid then IUPC placed. Start Pitocin titration to achieve adequate labor. Anticipate labor progress and SVD.  [redacted]w[redacted]d, CNM 9:52 AM

## 2020-11-28 NOTE — Progress Notes (Signed)
Called to bedside for FHR decel. FHR down into 60s x3 min. FSE applied. Small amt of VB noted. Pt repositioned and given Phenylephrine for BP 90/40. FHR remains in 70-80s x5 min, Dr. Ashok Pall notified and enroute to bedside. Pitocin off since 1108.  Donette Larry, CNM 12:19 PM

## 2020-11-28 NOTE — Progress Notes (Signed)
Patient is a 30 yo g1 @ 32+0, here for IOL for chtn with superimposed severe preeclampsia, also gdm. At 12:14 CNM Bambri notified me of prolonged deceleration. I arrived in the room at 12:15 and noted a 7 minute decel to the 60s. Cervical exam reported to be 5 cm. At 12:16 given persistent bradycardia and report of vaginal bleeding I called a code cesarean. Start time for the case is documented as 12:24 and delivery time 12:26.

## 2020-11-28 NOTE — Transfer of Care (Signed)
Immediate Anesthesia Transfer of Care Note  Patient: Rebecca Frey  Procedure(s) Performed: CESAREAN SECTION (N/A )  Patient Location: PACU  Anesthesia Type:Epidural  Level of Consciousness: awake and alert   Airway & Oxygen Therapy: Patient Spontanous Breathing  Post-op Assessment: Report given to RN  Post vital signs: Reviewed  Last Vitals:  Vitals Value Taken Time  BP    Temp    Pulse 67 11/28/20 1321  Resp    SpO2 100 % 11/28/20 1321  Vitals shown include unvalidated device data.  Last Pain:  Vitals:   11/28/20 0943  TempSrc: Oral  PainSc:       Patients Stated Pain Goal: 3 (11/27/20 1050)  Complications: No complications documented.

## 2020-11-28 NOTE — Lactation Note (Signed)
This note was copied from a baby's chart. Lactation Consultation Note  Patient Name: Boy Angee Gupton Today's Date: 11/28/2020 Reason for consult: Initial assessment;NICU baby;Preterm <34wks Age:29 hours  Initial visit to P1 mother of 7 hours old preterm currently in NICU. Mother is primipara, first time breastfeeding.    LC set up DEBP. Reinforced the importance of pumping as well as basics/cleaning/frequency. Talked about breast massage. Explained normal volume of colostrum according to gestation.   Reviewed Breastfeeding a NICU baby booklet and lactation services information with mother and encouraged to contact [facility] for support and questions.  Mother is a [facility] participant during this pregnancy. Offered to submit a referral on baby's behalf for a Hospital grade pump due to NICU admission.     Maternal Data Has patient been taught Hand Expression?: No Does the patient have breastfeeding experience prior to this delivery?: No  Feeding Mother's Current Feeding Choice: Breast Milk  Lactation Tools Discussed/Used Tools: Pump Breast pump type: Double-Electric Breast Pump Pump Education: Setup, frequency, and cleaning;Milk Storage Reason for Pumping: maternal infant separation Pumping frequency: 8-12 times in 24h  Interventions Interventions: DEBP;Hand pump;Expressed milk;Breast massage;Breast feeding basics reviewed  Discharge Pump: DEBP WIC Program: Yes  Consult Status Consult Status: Follow-up Date: 11/29/20 Follow-up type: In-patient    Marquize Seib A Higuera Ancidey 11/28/2020, 8:06 PM

## 2020-11-28 NOTE — Progress Notes (Addendum)
Shevawn Langenberg is a 30 y.o. G1P0 at [redacted]w[redacted]d admitted for IOL for cHTN with SI pre-E with SF.   Subjective: Feeling okay.  Some upper back pain, improving with warm towel.  No concerns.  Discussed FB insertion and she is agreeable.   Objective: BP 125/78   Pulse 80   Temp 98.9 F (37.2 C) (Oral)   Resp 18   Ht 5\' 4"  (1.626 m)   Wt 121.6 kg   LMP 03/19/2020   SpO2 98%   BMI 46.02 kg/m  Total I/O In: -  Out: 975 [Urine:975]  FHT: FHR: 130 bpm, variability: moderate, accelerations: Present, decelerations: Absent UC: Rare  SVE:   Dilation: Fingertip Effacement (%): Thick Station: Ballotable Exam by:: [email or web address]  Labs: Lab Results  Component Value Date   WBC 14.5 (H) 11/27/2020   HGB 11.5 (L) 11/27/2020   HCT 32.8 (L) 11/27/2020   MCV 87.9 11/27/2020   PLT 160 11/27/2020    Assessment / Plan: Genesia Caslin is a 31 y.o. G1P0 at [redacted]w[redacted]d admitted for IOL for cHTN with SI pre-E with SF.   #Labor: S/p Cytotec x2.  FB (60 cc) placed with spec and ringed forceps this check.  Give Cytotec 50 mcg buccally for 3rd dose.  #cHTN with SI pre-E with SF: On Mg. Continue labetalol 200 mg BID.  Antihypertensive regimen PRN severe BP. #Pain: Per patient request  #FWB: Cat I #ID: PCN for GBS prophylaxis in s/o prematurity #MOF: Breast #MOC: Condoms #Circ: Yes  Riann Oman [redacted]w[redacted]d, MD 11/28/2020, 12:24 AM

## 2020-11-28 NOTE — Anesthesia Postprocedure Evaluation (Signed)
Anesthesia Post Note  Patient: Rebecca Frey  Procedure(s) Performed: CESAREAN SECTION (N/A )     Patient location during evaluation: PACU Anesthesia Type: Epidural Level of consciousness: awake and alert Pain management: pain level controlled Vital Signs Assessment: post-procedure vital signs reviewed and stable Respiratory status: spontaneous breathing, nonlabored ventilation and respiratory function stable Cardiovascular status: stable Postop Assessment: no headache, no backache, epidural receding and no apparent nausea or vomiting Anesthetic complications: no   No complications documented.  Last Vitals:  Vitals:   11/28/20 1152 11/28/20 1156  BP: 115/64 103/64  Pulse: 72 73  Resp:    Temp:    SpO2:  97%    Last Pain:  Vitals:   11/28/20 0943  TempSrc: Oral  PainSc:    Pain Goal: Patients Stated Pain Goal: 3 (11/27/20 1050)                 Lannie Fields

## 2020-11-28 NOTE — Anesthesia Preprocedure Evaluation (Signed)
Anesthesia Evaluation  Patient identified by MRN, date of birth, ID band Patient awake    Reviewed: Allergy & Precautions, Patient's Chart, lab work & pertinent test results  Airway Mallampati: III  TM Distance: >3 FB Neck ROM: Full    Dental no notable dental hx.    Pulmonary former smoker,  Quit smoking 05/2020   Pulmonary exam normal breath sounds clear to auscultation       Cardiovascular hypertension (preE with SF on mag, superimposed chronic HTN), Pt. on medications Normal cardiovascular exam Rhythm:Regular Rate:Normal     Neuro/Psych negative neurological ROS  negative psych ROS   GI/Hepatic negative GI ROS, Neg liver ROS,   Endo/Other  diabetes, GestationalMorbid obesityBMI 46 H/H 11.5/34.6 plt 141 from 160 yesterday   Renal/GU negative Renal ROS  negative genitourinary   Musculoskeletal negative musculoskeletal ROS (+)   Abdominal (+) + obese,   Peds negative pediatric ROS (+)  Hematology negative hematology ROS (+)   Anesthesia Other Findings   Reproductive/Obstetrics (+) Pregnancy preE w/ SF on mag, 32 wks Concern for HELLP as plt have started to drop slightly Recent inc in vaginal bleeding, concern for abruption                             Anesthesia Physical Anesthesia Plan  ASA: III and emergent  Anesthesia Plan: Epidural   Post-op Pain Management:    Induction:   PONV Risk Score and Plan: 2  Airway Management Planned: Natural Airway  Additional Equipment: None  Intra-op Plan:   Post-operative Plan:   Informed Consent: I have reviewed the patients History and Physical, chart, labs and discussed the procedure including the risks, benefits and alternatives for the proposed anesthesia with the patient or authorized representative who has indicated his/her understanding and acceptance.       Plan Discussed with:   Anesthesia Plan Comments:          Anesthesia Quick Evaluation

## 2020-11-29 LAB — CBC
HCT: 25.2 % — ABNORMAL LOW (ref 36.0–46.0)
Hemoglobin: 8.1 g/dL — ABNORMAL LOW (ref 12.0–15.0)
MCH: 29.7 pg (ref 26.0–34.0)
MCHC: 32.1 g/dL (ref 30.0–36.0)
MCV: 92.3 fL (ref 80.0–100.0)
Platelets: 124 10*3/uL — ABNORMAL LOW (ref 150–400)
RBC: 2.73 MIL/uL — ABNORMAL LOW (ref 3.87–5.11)
RDW: 15.8 % — ABNORMAL HIGH (ref 11.5–15.5)
WBC: 10.5 10*3/uL (ref 4.0–10.5)
nRBC: 0 % (ref 0.0–0.2)

## 2020-11-29 LAB — CULTURE, BETA STREP (GROUP B ONLY)

## 2020-11-29 LAB — GLUCOSE, RANDOM: Glucose, Bld: 102 mg/dL — ABNORMAL HIGH (ref 70–99)

## 2020-11-29 MED ORDER — RHO D IMMUNE GLOBULIN 1500 UNIT/2ML IJ SOSY
300.0000 ug | PREFILLED_SYRINGE | Freq: Once | INTRAMUSCULAR | Status: AC
  Administered 2020-11-29: 300 ug via INTRAVENOUS
  Filled 2020-11-29: qty 2

## 2020-11-29 MED ORDER — SODIUM CHLORIDE 0.9 % IV SOLN
500.0000 mg | Freq: Once | INTRAVENOUS | Status: AC
  Administered 2020-11-29: 500 mg via INTRAVENOUS
  Filled 2020-11-29: qty 25

## 2020-11-29 NOTE — Lactation Note (Signed)
This note was copied from a baby's chart. Lactation Consultation Note  Patient Name: Rebecca Frey [id number] Date: 11/29/2020 Reason for consult: Follow-up assessment;Primapara;1st time breastfeeding;Preterm <34wks;NICU baby;Infant < 6lbs Age:29 hours   LC in to visit with P1 Mom of preterm infant in the NICU.  Mom on MgSO4 for GHTN and tired.  Mom states she has pumped twice.  Mom about to pump again.  Reviewed process and cleaning regime.    Mom desires a WIC pump, form signed and LC faxed request.  Mom to be discharged on Monday 2/21 at the earliest.  Mom aware of [facility] loaner if discharged on weekend or when [facility] is closed.  Encouraged pumping both breasts every 2-3 hrs when awake, adding breast massage and hand expression as well.  Mom denies any questions at this time.  To ask for help prn.  Lactation Tools Discussed/Used Tools: Pump Breast pump type: Double-Electric Breast Pump Pumping frequency: twice Pumped volume: 1 mL    Discharge Pump: DEBP WIC Program: Yes  Consult Status Consult Status: Follow-up Date: 11/30/20 Follow-up type: In-patient    [name] 11/29/2020, 9:11 AM

## 2020-11-29 NOTE — Progress Notes (Signed)
Subjective: Postpartum Day 1: Cesarean Delivery On magnesium til 1600 Patient reports incisional pain.    Objective: Vital signs in last 24 hours: Temp:  [97.7 F (36.5 C)-99.6 F (37.6 C)] 98.2 F (36.8 C) (02/19 0439) Pulse Rate:  [61-91] 64 (02/19 0439) Resp:  [15-22] 17 (02/19 0618) BP: (90-144)/(43-90) 124/54 (02/19 0439) SpO2:  [97 %-100 %] 100 % (02/19 0439)  Physical Exam:  General: alert, cooperative and no distress Lochia: appropriate Uterine Fundus: firm Incision: no significant drainage, no dehiscence DVT Evaluation: No evidence of DVT seen on physical exam.  Recent Labs    11/28/20 2210 11/29/20 0533  HGB 9.0* 8.1*  HCT 26.4* 25.2*    Assessment/Plan: Status post Cesarean section. Doing well postoperatively.  Continue current care Mag off later today, watch BP for med needs.  Lazaro Arms 11/29/2020, 8:32 AM

## 2020-11-30 LAB — RH IG WORKUP (INCLUDES ABO/RH)
ABO/RH(D): A NEG
Fetal Screen: NEGATIVE
Gestational Age(Wks): 32
Unit division: 0

## 2020-11-30 NOTE — Progress Notes (Signed)
Subjective:     Postpartum Day 2: Cesarean Delivery Pre eclampsia with severe features off magnesium since `1400 yesterday Patient reports incisional pain, tolerating PO and no problems voiding.   No complaintsObjective: Vital signs in last 24 hours: Temp:  [98.3 F (36.8 C)-99.2 F (37.3 C)] 98.3 F (36.8 C) (02/20 0424) Pulse Rate:  [66-83] 72 (02/20 0424) Resp:  [15-18] 15 (02/20 0424) BP: (103-130)/(50-79) 120/52 (02/20 0424) SpO2:  [97 %-100 %] 97 % (02/20 0424)   Vitals:   11/29/20 1640 11/29/20 1950 11/29/20 2249 11/30/20 0424  BP: (!) 104/58 130/78 (!) 112/50 (!) 120/52  Pulse: 67 83 76 72  Resp: 16 16 18 15   Temp: 98.4 F (36.9 C) 99.2 F (37.3 C) 98.4 F (36.9 C) 98.3 F (36.8 C)  TempSrc: Oral Oral Oral Oral  SpO2: 97% 99% 99% 97%  Weight:      Height:          Physical Exam:  General: alert, cooperative and no distress Lochia: appropriate Uterine Fundus: firm Incision: dressing is dry DVT Evaluation: No evidence of DVT seen on physical exam.  Recent Labs    11/28/20 2210 11/29/20 0533  HGB 9.0* 8.1*  HCT 26.4* 25.2*    Assessment/Plan: Status post Cesarean section. Doing well postoperatively.  Continue current care. Discharge home tomorrow No BP med needed currently  12/01/20 11/30/2020, 7:02 AM

## 2020-11-30 NOTE — Lactation Note (Addendum)
This note was copied from a baby's chart. Lactation Consultation Note  Patient Name: [name] [id number] Date: 11/30/2020   Age:29 hours P1, Preterm infant ( 32 w 2 days)  in NICU. Per mom, she has been using the DEBP every 3 hours for 15 minutes on initial setting.  Mom had recently finished using the DEBP and had 1 ml of colostrum she was placing in bullet when LC entered the room. LC notice a bright red ring around mom's left nipple and LC re-fitted mom with 27 mm breast flange for left breast only. LC reviewed hand expression and mom taught back and expressed additional 2 mls of colostrum she will take to NICU later today with infant labels. Mom was pleased to see higher yield of colostrum. Mom will start doing hands on pumping, after using the DEBP she will hand expressed afterwards. Mom knows to call RN or LC if she has any breastfeeding questions or concerns.  Maternal Data    Feeding    LATCH Score                    Lactation Tools Discussed/Used    Interventions    Discharge    Consult Status      Rebecca Frey 11/30/2020, 4:57 PM

## 2020-12-01 LAB — SURGICAL PATHOLOGY

## 2020-12-01 MED ORDER — OXYCODONE HCL 5 MG PO TABS: 5.0000 mg | ORAL_TABLET | ORAL | 0 refills | Status: DC | PRN

## 2020-12-01 MED ORDER — IBUPROFEN 600 MG PO TABS: 600.0000 mg | ORAL_TABLET | Freq: Four times a day (QID) | ORAL | 0 refills | Status: AC | PRN

## 2020-12-01 NOTE — Progress Notes (Signed)
Discharge instructions reviewed with patient including hypertension/preeclampsia symptoms to report to provider.  Pt verbalizes understanding with removing her incision dressing on Wednesday (Post-Op Day 5) and in attending appointment with OB on Friday for incision and BP check.

## 2020-12-01 NOTE — Discharge Instructions (Signed)
Cesarean Delivery, Care After This sheet gives you information about how to care for yourself after your procedure. Your health care provider may also give you more specific instructions. If you have problems or questions, contact your health care provider. What can I expect after the procedure? After the procedure, it is common to have:  A small amount of blood or clear fluid coming from the incision.  Some redness, swelling, and pain in your incision area.  Some abdominal pain and soreness.  Vaginal bleeding (lochia). Even though you did not have a vaginal delivery, you will still have vaginal bleeding and discharge.  Pelvic cramps.  Fatigue. You may have pain, swelling, and discomfort in the tissue between your vagina and your anus (perineum) if:  Your C-section was unplanned, and you were allowed to labor and push.  An incision was made in the area (episiotomy) or the tissue tore during attempted vaginal delivery. Follow these instructions at home: Incision care  Follow instructions from your health care provider about how to take care of your incision. Make sure you: ? Wash your hands with soap and water before you change your bandage (dressing). If soap and water are not available, use hand sanitizer. ? If you have a dressing, change it or remove it as told by your health care provider. ? Leave stitches (sutures), skin staples, skin glue, or adhesive strips in place. These skin closures may need to stay in place for 2 weeks or longer. If adhesive strip edges start to loosen and curl up, you may trim the loose edges. Do not remove adhesive strips completely unless your health care provider tells you to do that.  Check your incision area every day for signs of infection. Check for: ? More redness, swelling, or pain. ? More fluid or blood. ? Warmth. ? Pus or a bad smell.  Do not take baths, swim, or use a hot tub until your health care provider says it's okay. Ask your health  care provider if you can take showers.  When you cough or sneeze, hug a pillow. This helps with pain and decreases the chance of your incision opening up (dehiscing). Do this until your incision heals.   Medicines  Take over-the-counter and prescription medicines only as told by your health care provider.  If you were prescribed an antibiotic medicine, take it as told by your health care provider. Do not stop taking the antibiotic even if you start to feel better.  Do not drive or use heavy machinery while taking prescription pain medicine. Lifestyle  Do not drink alcohol. This is especially important if you are breastfeeding or taking pain medicine.  Do not use any products that contain nicotine or tobacco, such as cigarettes, e-cigarettes, and chewing tobacco. If you need help quitting, ask your health care provider. Eating and drinking  Drink at least 8 eight-ounce glasses of water every day unless told not to by your health care provider. If you breastfeed, you may need to drink even more water.  Eat high-fiber foods every day. These foods may help prevent or relieve constipation. High-fiber foods include: ? Whole grain cereals and breads. ? Brown rice. ? Beans. ? Fresh fruits and vegetables. Activity  If possible, have someone help you care for your baby and help with household activities for at least a few days after you leave the hospital.  Return to your normal activities as told by your health care provider. Ask your health care provider what activities are safe for   you.  Rest as much as possible. Try to rest or take a nap while your baby is sleeping.  Do not lift anything that is heavier than 10 lbs (4.5 kg), or the limit that you were told, until your health care provider says that it is safe.  Talk with your health care provider about when you can engage in sexual activity. This may depend on your: ? Risk of infection. ? How fast you heal. ? Comfort and desire to  engage in sexual activity.   General instructions  Do not use tampons or douches until your health care provider approves.  Wear loose, comfortable clothing and a supportive and well-fitting bra.  Keep your perineum clean and dry. Wipe from front to back when you use the toilet.  If you pass a blood clot, save it and call your health care provider to discuss. Do not flush blood clots down the toilet before you get instructions from your health care provider.  Keep all follow-up visits for you and your baby as told by your health care provider. This is important. Contact a health care provider if:  You have: ? A fever. ? Bad-smelling vaginal discharge. ? Pus or a bad smell coming from your incision. ? Difficulty or pain when urinating. ? A sudden increase or decrease in the frequency of your bowel movements. ? More redness, swelling, or pain around your incision. ? More fluid or blood coming from your incision. ? A rash. ? Nausea. ? Little or no interest in activities you used to enjoy. ? Questions about caring for yourself or your baby.  Your incision feels warm to the touch.  Your breasts turn red or become painful or hard.  You feel unusually sad or worried.  You vomit.  You pass a blood clot from your vagina.  You urinate more than usual.  You are dizzy or light-headed. Get help right away if:  You have: ? Pain that does not go away or get better with medicine. ? Chest pain. ? Difficulty breathing. ? Blurred vision or spots in your vision. ? Thoughts about hurting yourself or your baby. ? New pain in your abdomen or in one of your legs. ? A severe headache.  You faint.  You bleed from your vagina so much that you fill more than one sanitary pad in one hour. Bleeding should not be heavier than your heaviest period. Summary  After the procedure, it is common to have pain at your incision site, abdominal cramping, and slight bleeding from your vagina.  Check  your incision area every day for signs of infection.  Tell your health care provider about any unusual symptoms.  Keep all follow-up visits for you and your baby as told by your health care provider. This information is not intended to replace advice given to you by your health care provider. Make sure you discuss any questions you have with your health care provider. Document Revised: 04/05/2018 Document Reviewed: 04/05/2018 Elsevier Patient Education  2021 [employer] Inc.  

## 2020-12-01 NOTE — Lactation Note (Signed)
This note was copied from a baby's chart. Lactation Consultation Note  Patient Name: Rebecca Frey [id number] Date: 12/01/2020 Reason for consult: Follow-up assessment;Primapara;1st time breastfeeding;Infant < 6lbs;Preterm <34wks;NICU baby Age:29 hours   LC in to visit with P1 Mom of preterm infant in the NICU.  Mom has been consistently pumping every 2-3 hrs.  Mom had just pumped and expressed 2-3 ml, but spilled some of the colostrum onto her bedside table.  Praised Mom for her commitment to pumping to provide for her baby.   Mom aware of benefits of STS and frequent pumping.  Mom to speak with [facility] today about obtaining her pump on discharge.  Mom aware to take pump parts with her, demonstrated how to remove all parts.  Engorgement prevention and treatment reviewed.  Reviewed proper washing and drying routine.  Mom aware of pump in baby's room. Mom aware of lactation support available while baby is in NICU and to ask her baby's RN to contact LC prn.  Lactation Tools Discussed/Used Tools: Pump Breast pump type: Double-Electric Breast Pump  Discharge Discharge Education: Engorgement and breast care  Consult Status Consult Status: Follow-up Date: 12/08/20 Follow-up type: In-patient    [name] 12/01/2020, 9:21 AM

## 2020-12-01 NOTE — Discharge Summary (Signed)
Physician Discharge Summary  Patient ID: Rebecca Frey MRN: [id number] DOB/AGE: [birth year] 30 y.o.  Admit date: 11/18/2020 Discharge date: 12/01/2020  Admission Diagnoses:Pre eclampsia w severe features  Discharge Diagnoses:  Principal Problem:   Vaginal bleeding in pregnancy, third trimester Active Problems:   Rh negative state in antepartum period   Gestational diabetes   Chronic hypertension with superimposed preeclampsia   Elevated ALT measurement   Discharged Condition: good  Hospital Course: hospital observation for severe features pre eclampsia for 11days Experienced placental abruption and had emergency Caesarean section 11/28/20 Unremarkable post operative course   Consults: MFM  Significant Diagnostic Studies: labs:   Treatments: surgery: Cesarean section  Discharge Exam: Blood pressure 138/81, pulse 75, temperature 98.3 F (36.8 C), temperature source Oral, resp. rate 18, height '5\' 4"'  (1.626 m), weight 121.6 kg, last menstrual period 03/19/2020, SpO2 100 %, unknown if currently breastfeeding. General appearance: alert, cooperative and no distress GI: soft, non-tender; bowel sounds normal; no masses,  no organomegaly Incision/Wound:clean dry intact  Disposition: Discharge disposition: 01-Home or Self Care       Discharge Instructions    Call MD for:  persistant nausea and vomiting   Complete by: As directed    Call MD for:  severe uncontrolled pain   Complete by: As directed    Call MD for:  temperature >100.4   Complete by: As directed    Diet - low sodium heart healthy   Complete by: As directed    Driving Restrictions   Complete by: As directed    No driving for 1 week   Increase activity slowly   Complete by: As directed    Leave dressing on - Keep it clean, dry, and intact until clinic visit   Complete by: As directed    Lifting restrictions   Complete by: As directed    Do not lift more than 10 pounds   Sexual Activity Restrictions    Complete by: As directed    No sex for 6 weeks     Allergies as of 12/01/2020      Reactions   Cinnamon Other (See Comments)   Sores in mouth   Coconut Flavor Other (See Comments)   headache   Latex Other (See Comments)   Burns skin      Medication List    STOP taking these medications   Accu-Chek Guide Me w/Device Kit   Accu-Chek Softclix Lancets lancets   Blood Pressure Monitor Misc   glucose blood test strip     TAKE these medications   aspirin 81 MG chewable tablet Chew 2 tablets (162 mg total) by mouth daily.   Flintstones Complete 18 MG Chew Take 2 daily   ibuprofen 600 MG tablet Commonly known as: ADVIL Take 1 tablet (600 mg total) by mouth every 6 (six) hours as needed for fever or headache.   oxyCODONE 5 MG immediate release tablet Commonly known as: Oxy IR/ROXICODONE Take 1-2 tablets (5-10 mg total) by mouth every 4 (four) hours as needed for moderate pain.            Discharge Care Instructions  (From admission, onward)         Start     Ordered   12/01/20 0000  Leave dressing on - Keep it clean, dry, and intact until clinic visit        12/01/20 0909          Follow-up Information    [facility] Follow up on 12/05/2020.  Specialty: Obstetrics and Gynecology Why: BP check and incision check Contact information: [address] [phone]              Signed: Florian Buff 12/01/2020, 9:11 AM

## 2020-12-02 ENCOUNTER — Telehealth: Payer: Self-pay

## 2020-12-02 NOTE — Telephone Encounter (Signed)
Transition Care Management Follow-up Telephone Call  Date of discharge and from where: 12/01/2020 from [facility]  How have you been since you were released from the hospital? Pt stated that she is feeling well.    Any questions or concerns? No  Items Reviewed:  Did the pt receive and understand the discharge instructions provided? Yes   Medications obtained and verified? Yes   Other? No   Any new allergies since your discharge? No   Dietary orders reviewed? N/A  Do you have support at home? Yes   Functional Questionnaire: (I = Independent and D = Dependent) ADLs: I  Bathing/Dressing- I  Meal Prep- I  Eating- I  Maintaining continence- I  Transferring/Ambulation- I  Managing Meds- I   Follow up appointments reviewed:   PCP Hospital f/u appt confirmed? Yes  Scheduled to see Rebecca Elm, MD on 12/05/2020 @ 09:50am.  Are transportation arrangements needed? No  If their condition worsens, is the pt aware to call PCP or go to the Emergency Dept.? Yes Was the patient provided with contact information for the PCP's office or ED? Yes Was to pt encouraged to call back with questions or concerns? Yes

## 2020-12-05 ENCOUNTER — Other Ambulatory Visit: Payer: Self-pay

## 2020-12-05 ENCOUNTER — Other Ambulatory Visit: Payer: Medicaid Other

## 2020-12-05 ENCOUNTER — Ambulatory Visit (INDEPENDENT_AMBULATORY_CARE_PROVIDER_SITE_OTHER): Payer: Medicaid Other | Admitting: Obstetrics and Gynecology

## 2020-12-05 ENCOUNTER — Encounter: Payer: Medicaid Other | Admitting: Obstetrics and Gynecology

## 2020-12-05 ENCOUNTER — Encounter: Payer: Self-pay | Admitting: Obstetrics and Gynecology

## 2020-12-05 VITALS — BP 135/83 | HR 89 | Wt 256.4 lb

## 2020-12-05 DIAGNOSIS — O119 Pre-existing hypertension with pre-eclampsia, unspecified trimester: Secondary | ICD-10-CM

## 2020-12-05 DIAGNOSIS — O24419 Gestational diabetes mellitus in pregnancy, unspecified control: Secondary | ICD-10-CM

## 2020-12-05 NOTE — Progress Notes (Signed)
Rebecca Frey presents for incision check S/P c section on 11/28/20 for [facility] with [facility] and placental abruption. She reports feeling tired but otherwise doing well. Denies any bowel or bladder dysfunction Tolerating diet Breast pumping Infant stable in NICU  PE AF VSS Lungs clear Heart RRR Abd soft + BS, incision, honeycomb dressing removed and new steri strips placed, healing well  A/P PP incision check  Doing well. Will check CMP d/t SPEC and elevated LFT's. BP stable without meds. Continue to monitor. Increase activity as tolerates. Wound care reviewed. PP check in 4 weeks with Glucola d/t H/O GDM.

## 2020-12-05 NOTE — Patient Instructions (Signed)
Cesarean Delivery, Care After This sheet gives you information about how to care for yourself after your procedure. Your health care provider may also give you more specific instructions. If you have problems or questions, contact your health care provider. What can I expect after the procedure? After the procedure, it is common to have:  A small amount of blood or clear fluid coming from the incision.  Some redness, swelling, and pain in your incision area.  Some abdominal pain and soreness.  Vaginal bleeding (lochia). Even though you did not have a vaginal delivery, you will still have vaginal bleeding and discharge.  Pelvic cramps.  Fatigue. You may have pain, swelling, and discomfort in the tissue between your vagina and your anus (perineum) if:  Your C-section was unplanned, and you were allowed to labor and push.  An incision was made in the area (episiotomy) or the tissue tore during attempted vaginal delivery. Follow these instructions at home: Incision care  Follow instructions from your health care provider about how to take care of your incision. Make sure you: ? Wash your hands with soap and water before you change your bandage (dressing). If soap and water are not available, use hand sanitizer. ? If you have a dressing, change it or remove it as told by your health care provider. ? Leave stitches (sutures), skin staples, skin glue, or adhesive strips in place. These skin closures may need to stay in place for 2 weeks or longer. If adhesive strip edges start to loosen and curl up, you may trim the loose edges. Do not remove adhesive strips completely unless your health care provider tells you to do that.  Check your incision area every day for signs of infection. Check for: ? More redness, swelling, or pain. ? More fluid or blood. ? Warmth. ? Pus or a bad smell.  Do not take baths, swim, or use a hot tub until your health care provider says it's okay. Ask your health  care provider if you can take showers.  When you cough or sneeze, hug a pillow. This helps with pain and decreases the chance of your incision opening up (dehiscing). Do this until your incision heals.   Medicines  Take over-the-counter and prescription medicines only as told by your health care provider.  If you were prescribed an antibiotic medicine, take it as told by your health care provider. Do not stop taking the antibiotic even if you start to feel better.  Do not drive or use heavy machinery while taking prescription pain medicine. Lifestyle  Do not drink alcohol. This is especially important if you are breastfeeding or taking pain medicine.  Do not use any products that contain nicotine or tobacco, such as cigarettes, e-cigarettes, and chewing tobacco. If you need help quitting, ask your health care provider. Eating and drinking  Drink at least 8 eight-ounce glasses of water every day unless told not to by your health care provider. If you breastfeed, you may need to drink even more water.  Eat high-fiber foods every day. These foods may help prevent or relieve constipation. High-fiber foods include: ? Whole grain cereals and breads. ? Brown rice. ? Beans. ? Fresh fruits and vegetables. Activity  If possible, have someone help you care for your baby and help with household activities for at least a few days after you leave the hospital.  Return to your normal activities as told by your health care provider. Ask your health care provider what activities are safe for   you.  Rest as much as possible. Try to rest or take a nap while your baby is sleeping.  Do not lift anything that is heavier than 10 lbs (4.5 kg), or the limit that you were told, until your health care provider says that it is safe.  Talk with your health care provider about when you can engage in sexual activity. This may depend on your: ? Risk of infection. ? How fast you heal. ? Comfort and desire to  engage in sexual activity.   General instructions  Do not use tampons or douches until your health care provider approves.  Wear loose, comfortable clothing and a supportive and well-fitting bra.  Keep your perineum clean and dry. Wipe from front to back when you use the toilet.  If you pass a blood clot, save it and call your health care provider to discuss. Do not flush blood clots down the toilet before you get instructions from your health care provider.  Keep all follow-up visits for you and your baby as told by your health care provider. This is important. Contact a health care provider if:  You have: ? A fever. ? Bad-smelling vaginal discharge. ? Pus or a bad smell coming from your incision. ? Difficulty or pain when urinating. ? A sudden increase or decrease in the frequency of your bowel movements. ? More redness, swelling, or pain around your incision. ? More fluid or blood coming from your incision. ? A rash. ? Nausea. ? Little or no interest in activities you used to enjoy. ? Questions about caring for yourself or your baby.  Your incision feels warm to the touch.  Your breasts turn red or become painful or hard.  You feel unusually sad or worried.  You vomit.  You pass a blood clot from your vagina.  You urinate more than usual.  You are dizzy or light-headed. Get help right away if:  You have: ? Pain that does not go away or get better with medicine. ? Chest pain. ? Difficulty breathing. ? Blurred vision or spots in your vision. ? Thoughts about hurting yourself or your baby. ? New pain in your abdomen or in one of your legs. ? A severe headache.  You faint.  You bleed from your vagina so much that you fill more than one sanitary pad in one hour. Bleeding should not be heavier than your heaviest period. Summary  After the procedure, it is common to have pain at your incision site, abdominal cramping, and slight bleeding from your vagina.  Check  your incision area every day for signs of infection.  Tell your health care provider about any unusual symptoms.  Keep all follow-up visits for you and your baby as told by your health care provider. This information is not intended to replace advice given to you by your health care provider. Make sure you discuss any questions you have with your health care provider. Document Revised: 04/05/2018 Document Reviewed: 04/05/2018 Elsevier Patient Education  2021 [employer]  

## 2020-12-06 LAB — COMPREHENSIVE METABOLIC PANEL
ALT: 72 IU/L — ABNORMAL HIGH (ref 0–32)
AST: 32 IU/L (ref 0–40)
Albumin/Globulin Ratio: 1.3 (ref 1.2–2.2)
Albumin: 4 g/dL (ref 3.9–5.0)
Alkaline Phosphatase: 137 IU/L — ABNORMAL HIGH (ref 44–121)
BUN/Creatinine Ratio: 13 (ref 9–23)
BUN: 10 mg/dL (ref 6–20)
Bilirubin Total: 0.3 mg/dL (ref 0.0–1.2)
CO2: 21 mmol/L (ref 20–29)
Calcium: 9.4 mg/dL (ref 8.7–10.2)
Chloride: 102 mmol/L (ref 96–106)
Creatinine, Ser: 0.77 mg/dL (ref 0.57–1.00)
GFR calc Af Amer: 122 mL/min/{1.73_m2} (ref >59)
GFR calc non Af Amer: 105 mL/min/{1.73_m2} (ref >59)
Globulin, Total: 3.2 g/dL (ref 1.5–4.5)
Glucose: 83 mg/dL (ref 65–99)
Potassium: 4.7 mmol/L (ref 3.5–5.2)
Sodium: 141 mmol/L (ref 134–144)
Total Protein: 7.2 g/dL (ref 6.0–8.5)

## 2020-12-12 ENCOUNTER — Other Ambulatory Visit: Payer: Medicaid Other

## 2020-12-12 ENCOUNTER — Encounter: Payer: Medicaid Other | Admitting: Obstetrics & Gynecology

## 2020-12-19 ENCOUNTER — Other Ambulatory Visit: Payer: Medicaid Other

## 2020-12-19 ENCOUNTER — Encounter: Payer: Medicaid Other | Admitting: Obstetrics & Gynecology

## 2020-12-24 DIAGNOSIS — I609 Nontraumatic subarachnoid hemorrhage, unspecified: Secondary | ICD-10-CM | POA: Diagnosis not present

## 2020-12-24 DIAGNOSIS — I615 Nontraumatic intracerebral hemorrhage, intraventricular: Secondary | ICD-10-CM | POA: Diagnosis not present

## 2020-12-24 DIAGNOSIS — Q046 Congenital cerebral cysts: Secondary | ICD-10-CM | POA: Diagnosis not present

## 2020-12-26 ENCOUNTER — Other Ambulatory Visit: Payer: Medicaid Other

## 2020-12-26 ENCOUNTER — Encounter: Payer: Medicaid Other | Admitting: Women's Health

## 2021-01-02 ENCOUNTER — Encounter: Payer: Medicaid Other | Admitting: Obstetrics & Gynecology

## 2021-01-02 ENCOUNTER — Other Ambulatory Visit: Payer: Self-pay

## 2021-01-02 ENCOUNTER — Other Ambulatory Visit: Payer: Medicaid Other

## 2021-01-02 ENCOUNTER — Encounter: Payer: Self-pay | Admitting: Advanced Practice Midwife

## 2021-01-02 ENCOUNTER — Ambulatory Visit (INDEPENDENT_AMBULATORY_CARE_PROVIDER_SITE_OTHER): Payer: Medicaid Other | Admitting: Advanced Practice Midwife

## 2021-01-02 DIAGNOSIS — Z131 Encounter for screening for diabetes mellitus: Secondary | ICD-10-CM | POA: Diagnosis not present

## 2021-01-02 DIAGNOSIS — Z8632 Personal history of gestational diabetes: Secondary | ICD-10-CM

## 2021-01-02 NOTE — Progress Notes (Signed)
POSTPARTUM VISIT Patient name: Rebecca Frey MRN [id number]  Date of birth: 1991-01-06 Chief Complaint:   Postpartum Care (2 hr gtt)  History of Present Illness:   Rebecca Frey is a 30 y.o. G62P0101 Caucasian female being seen today for a postpartum visit. She is 5 weeks postpartum following a primary cesarean section, low transverse incision at 32.0 gestational weeks. IOL: Yes, for cHTN with worsening SIPE (also with GDMA1). Anesthesia: epidural.  Laceration: n/a.  Complications: Pt admitted at 30.4wks to [facility] for cHTN with severe SIPE; by 31.6wks she had worsening labs and BP elevations with the decision for IOL per MFM; mag sulfate started along with cx ripening. At 32.0wks during the IOL process she had a fetal bradycardia and decision for stat pLTCS at 5cm, where ultimately a placental abruption was discovered. Inpatient contraception: no.   Pregnancy complicated by cHTN with SIPE, GDMA1, Rh neg. Tobacco use: no. Substance use disorder: no. Last pap smear: Nov 2021 and results were NILM w/ HRHPV negative. Next pap smear due: Nov 2024 No LMP recorded. (Menstrual status: Lactating).  Postpartum course has been uncomplicated. Bleeding no bleeding. Bowel function is normal. Bladder function is normal. Urinary incontinence? No, fecal incontinence? No Patient is not sexually active. Last sexual activity: prior to delivery.  Desired contraception: Condoms. Patient does not know about a pregnancy in the future.  Desired family size is unsure about the number children.   Upstream - 01/02/21 0914      Pregnancy Intention Screening   Does the patient want to become pregnant in the next year? No    Does the patient's partner want to become pregnant in the next year? No    Would the patient like to discuss contraceptive options today? No      Contraception Wrap Up   Current Method --   breast feeding   End Method --   breast feeding   Contraception Counseling Provided No          The  pregnancy intention screening data noted above was reviewed. Potential methods of contraception were discussed. The patient elected to proceed with Female Condom.   Edinburgh Postpartum Depression Screening: Negative  Edinburgh Postnatal Depression Scale - 01/02/21 0913      Edinburgh Postnatal Depression Scale:  In the Past 7 Days   I have been able to laugh and see the funny side of things. 1    I have looked forward with enjoyment to things. 0    I have blamed myself unnecessarily when things went wrong. 0    I have been anxious or worried for no good reason. 2    I have felt scared or panicky for no good reason. 2    Things have been getting on top of me. 1    I have been so unhappy that I have had difficulty sleeping. 0    I have felt sad or miserable. 1    I have been so unhappy that I have been crying. 0    The thought of harming myself has occurred to me. 0    Edinburgh Postnatal Depression Scale Total 7          Baby's course has been complicated by currently in the NICU due to prematurity. Baby is feeding by breast and bottle: milk supply adequate. Infant has a pediatrician/family doctor? Yes.  Childcare strategy if returning to work/school: not discussed.  Pt has material needs met for her and baby: Yes.   Review of Systems:  Pertinent items are noted in HPI Denies Abnormal vaginal discharge w/ itching/odor/irritation, headaches, visual changes, shortness of breath, chest pain, abdominal pain, severe nausea/vomiting, or problems with urination or bowel movements. Pertinent History Reviewed:  Reviewed past medical,surgical, obstetrical and family history.  Reviewed problem list, medications and allergies. OB History  Gravida Para Term Preterm AB Living  _0 SAB IAB Ectopic Multiple Live Births        0 1    # Outcome Date GA Lbr Len/2nd Weight Sex Delivery Anes PTL Lv  1 Preterm 11/28/20 [redacted]w[redacted]d 2 lb 12.1 oz (1.25 kg) M CS-LTranv EPI  LIV     Birth Comments:  preterm female infant   Physical Assessment:   Vitals:   01/02/21 0900  BP: 114/77  Pulse: 68  Weight: 270 lb (122.5 kg)  Height: _1  (1.626 m)  Body mass index is 46.35 kg/m.       Physical Examination:   General appearance: alert, well appearing, and in no distress  Mental status: alert, oriented to person, place, and time  Skin: warm & dry   Cardiovascular: normal heart rate noted   Respiratory: normal respiratory effort, no distress   Breasts: deferred, no complaints   Abdomen: soft, non-tender; well-approximated incision- healing   Pelvic: examination not indicated  Rectal: didn't assess for hemorrhoids  Extremities: 1+ edema        No results found for this or any previous visit (from the past 24 hour(s)).  Assessment & Plan:  1) Postpartum exam 2) Five wks s/p primary cesarean section, low transverse incision 3) Breast feeding (pumping for NICU infant) 4) Depression screening-neg 5) Contraception counseling  6) cHTN with severe pre-e: BP stable postpartum without meds 7) GDMA1: 2hr GTT in process  Essential components of care per ACOG recommendations:  1.  Mood and well being:  . If positive depression screen, discussed and plan developed.  . If using tobacco we discussed reduction/cessation and risk of relapse . If current substance abuse, we discussed and referral to local resources was offered.   2. Infant care and feeding:  . If breastfeeding, discussed returning to work, pumping, breastfeeding-associated pain, guidance regarding return to fertility while lactating if not using another method. If needed, patient was provided with a letter to be allowed to pump q 2-3hrs to support lactation in a private location with access to a refrigerator to store breastmilk.   . Recommended that all caregivers be immunized for flu, pertussis and other preventable communicable diseases . If pt does not have material needs met for her/baby, referred to local resources for  help obtaining these.  3. Sexuality, contraception and birth spacing . Provided guidance regarding sexuality, management of dyspareunia, and resumption of intercourse . Discussed avoiding interpregnancy interval <690ms and recommended birth spacing of 18 months  4. Sleep and fatigue . Discussed coping options for fatigue and sleep disruption . Encouraged family/partner/community support of 4 hrs of uninterrupted sleep to help with mood and fatigue  5. Physical recovery  . If pt had a C/S, assessed incisional pain and providing guidance on normal vs prolonged recovery . If pt had a laceration, perineal healing and pain reviewed.  . If urinary or fecal incontinence, discussed management and referred to PT or uro/gyn if indicated  . Patient is safe to resume physical activity. Discussed attainment of healthy weight.  6.  Chronic disease management . Discussed pregnancy complications if any, and their implications for future  childbearing and long-term maternal health. . Review recommendations for prevention of recurrent pregnancy complications, such as 17 hydroxyprogesterone caproate to reduce risk for recurrent PTB yes, or aspirin to reduce risk of preeclampsia yes. . Pt had GDM: Yes. If yes, 2hr GTT: already in process today. . Reviewed medications and non-pregnant dosing including consideration of whether pt is breastfeeding using a reliable resource such as LactMed: yes . Referred for f/u w/ PCP or subspecialist providers as indicated: not applicable  7. Health maintenance . Mammogram at 29yo or earlier if indicated . Pap smears as indicated  Meds: No orders of the defined types were placed in this encounter.   Follow-up: Return in about 1 year (around 01/02/2022) for Physical.   No orders of the defined types were placed in this encounter.   Myrtis Ser CNM 01/02/2021 9:49 AM

## 2021-01-03 LAB — GLUCOSE TOLERANCE, 2 HOURS W/ 1HR
Glucose, 1 hour: 145 mg/dL (ref 65–179)
Glucose, 2 hour: 106 mg/dL (ref 65–152)
Glucose, Fasting: 81 mg/dL (ref 65–91)

## 2021-01-09 ENCOUNTER — Other Ambulatory Visit: Payer: Medicaid Other

## 2021-01-09 ENCOUNTER — Encounter: Payer: Medicaid Other | Admitting: Women's Health

## 2021-01-16 ENCOUNTER — Encounter: Payer: Medicaid Other | Admitting: Obstetrics & Gynecology

## 2021-01-16 ENCOUNTER — Other Ambulatory Visit: Payer: Medicaid Other

## 2021-01-23 ENCOUNTER — Encounter: Payer: Medicaid Other | Admitting: Women's Health

## 2021-01-23 ENCOUNTER — Other Ambulatory Visit: Payer: Medicaid Other

## 2021-01-30 ENCOUNTER — Other Ambulatory Visit: Payer: Medicaid Other

## 2021-01-30 ENCOUNTER — Encounter: Payer: Medicaid Other | Admitting: Obstetrics & Gynecology

## 2022-09-06 IMAGING — US US MFM FETAL BPP W/O NON-STRESS
1 series · 15 of 24 positions shown · non-contrast
Comparison: none

[Series 1: us mfm fetal bpp w/o non-stress · 24 acquisitions, 15 frames shown]
[im 1/24]
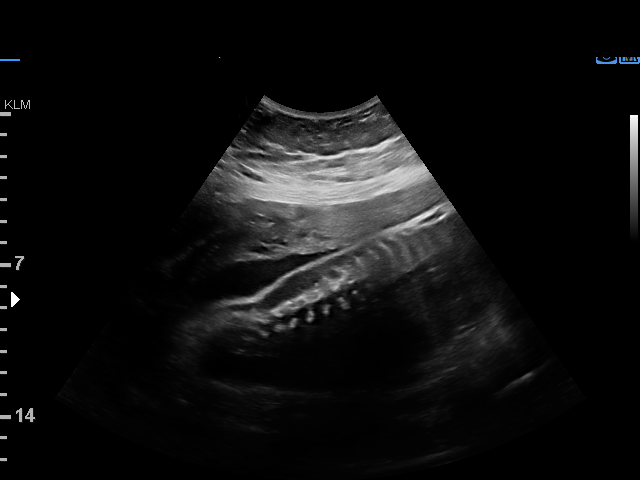
[im 3/24]
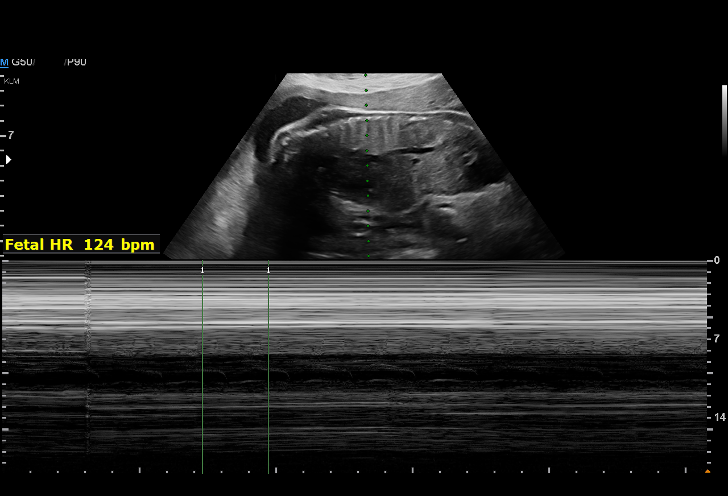
[im 5/24]
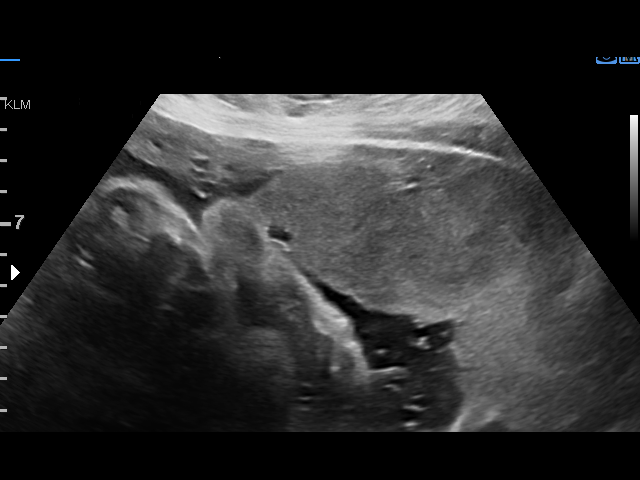
[im 6/24]
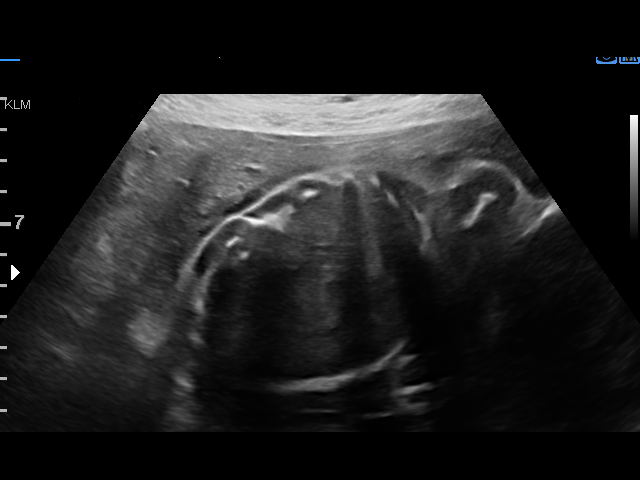
[im 8/24]
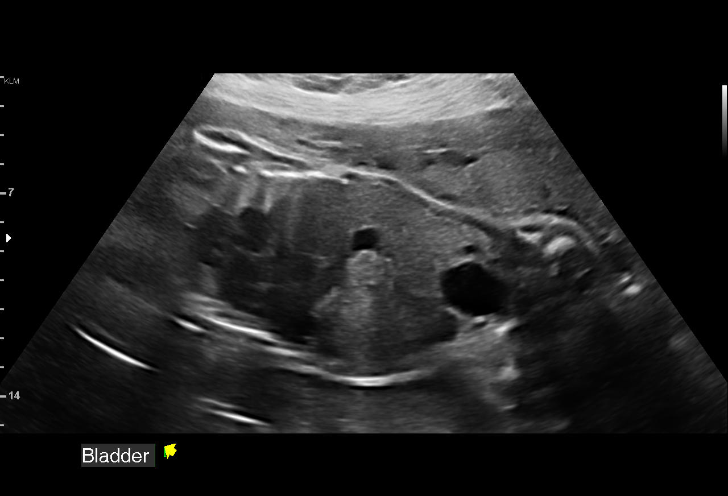
[im 9/24]
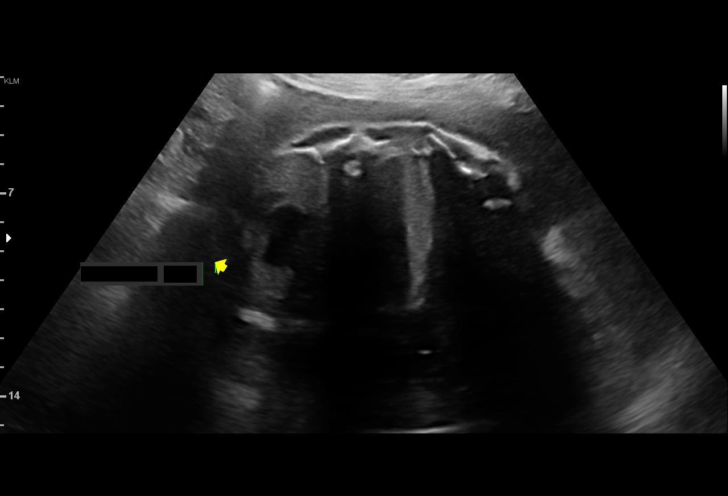
[im 11/24]
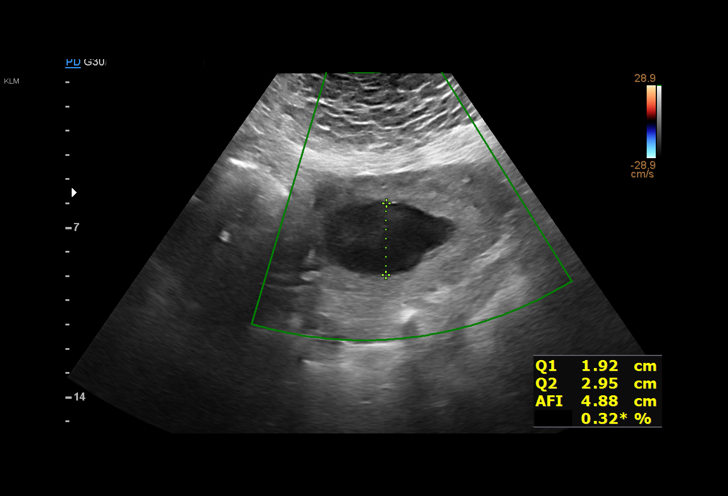
[im 13/24]
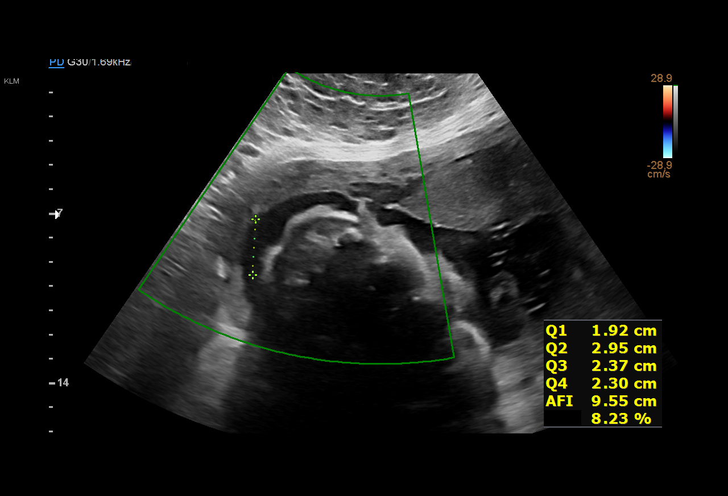
[im 14/24]
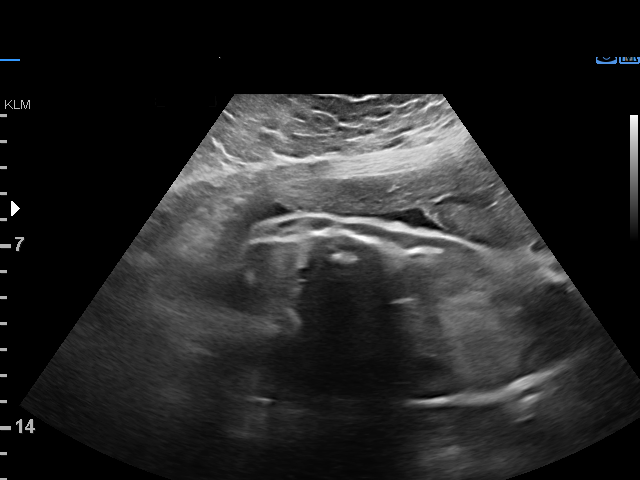
[im 16/24]
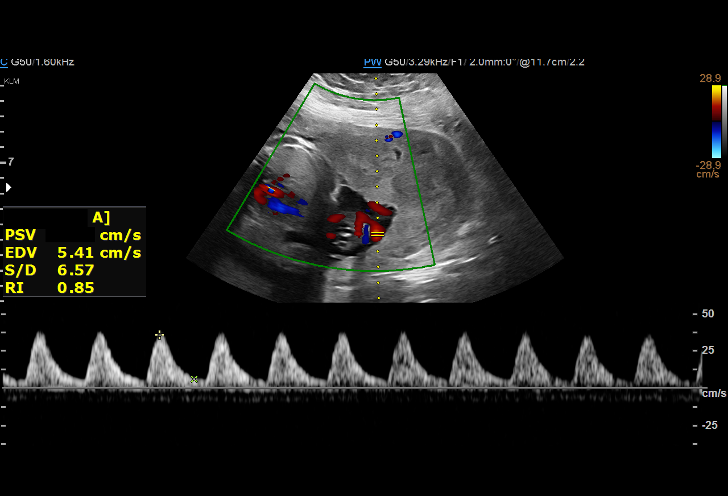
[im 17/24]
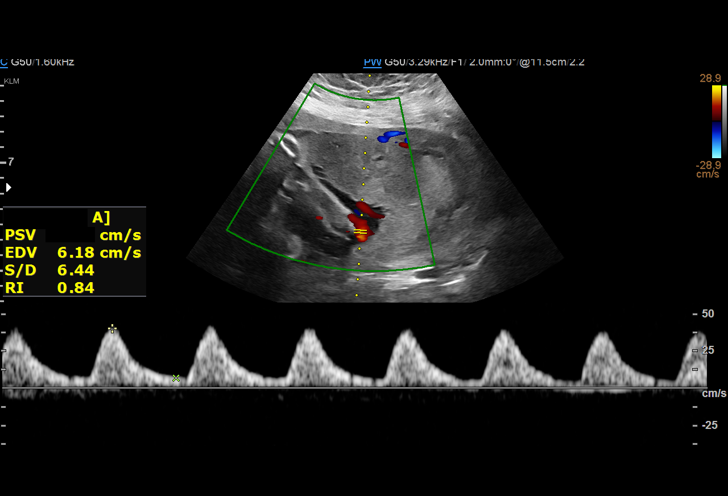
[im 19/24]
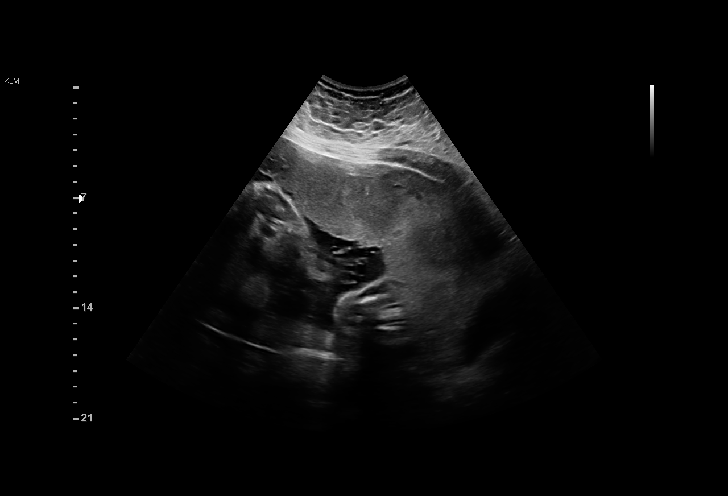
[im 21/24]
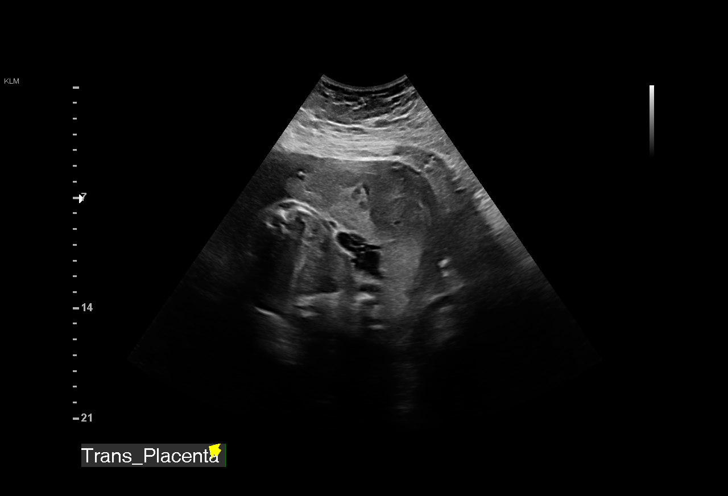
[im 22/24]
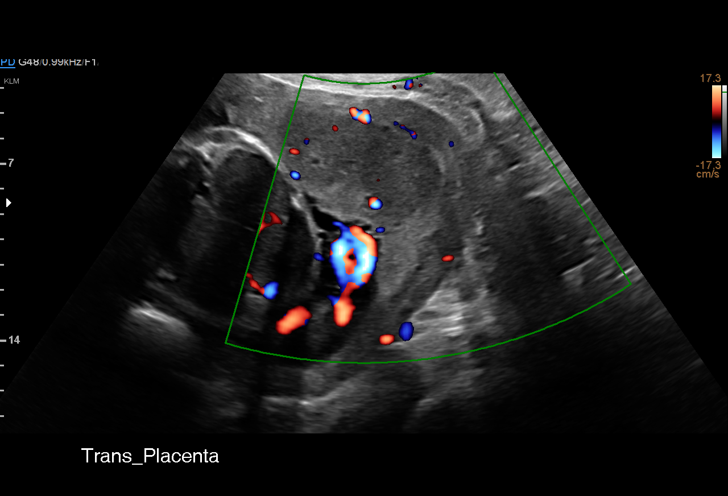
[im 24/24]
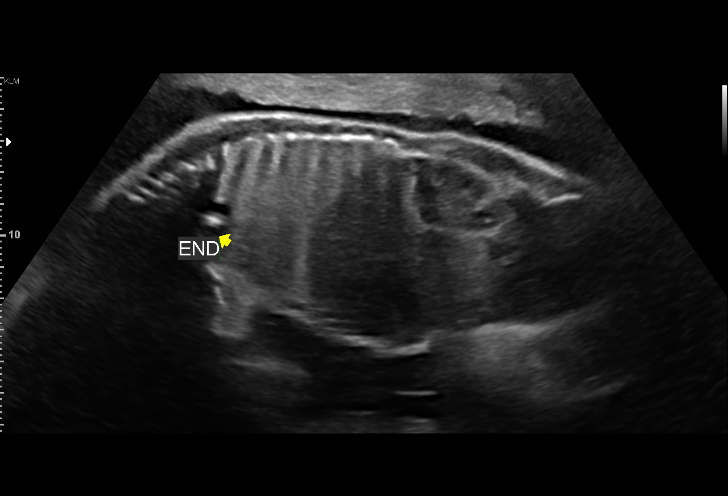

[15 of 24 positions shown; findings below may reference images not displayed]

2  US MFM UA CORD DOPPLER                76820.02    LUL ANTON

Indications

 Severe preeclampsia, third trimester
 31 weeks gestation of pregnancy
 Hypertension - Chronic with superimposed
 preeclampsia (labetalol)
 Gestational diabetes in pregnancy, diet
 controlled
 Rh negative state in antepartum
 Small for gestational age fetus affecting
 management of mother
 Non-reactive NST, FHR decelerations
Fetal Evaluation

 Num Of Fetuses:         1
 Fetal Heart Rate(bpm):  124
 Cardiac Activity:       Observed
 Presentation:           Breech
 Placenta:               Anterior

 Amniotic Fluid
 AFI FV:      Within normal limits

 AFI Sum(cm)     %Tile       Largest Pocket(cm)
 9.54            12

 RUQ(cm)       RLQ(cm)       LUQ(cm)        LLQ(cm)

Biophysical Evaluation

 Amniotic F.V:   Within normal limits       F. Tone:        Observed
 F. Movement:    Observed                   Score:          [DATE]
 F. Breathing:   Not Observed
OB History

 Gravidity:    1
Gestational Age

 LMP:           36w 0d        Date:  03/19/20                 EDD:   12/24/20
 Best:          31w 5d     Det. By:  Early Ultrasound         EDD:   01/23/21
                                     (06/19/20)
Anatomy

 Stomach:               Appears normal, left   Bladder:                Appears normal
                        sided
Doppler - Fetal Vessels

 Umbilical Artery
  S/D     %tile      RI    %tile                             ADFV    RDFV
  6.38   > 97.5    0.84   > 97.5                                No      No

Comments

 This patient has been hospitalized due to preeclampsia with a
 mildly elevated ALT level.  Fetal growth restriction was noted
 on her last growth ultrasound.

 Doppler studies of the umbilical arteries performed today due
 to IUGR continues to show an elevated S/D ratio of 6.38.
 There were no signs of absent or reversed end-diastolic flow.

 Low normal amniotic fluid with a total AFI of 9.5 cm is noted.

 A biophysical profile performed today was [DATE].  The
 patient had a reactive NST earlier today.

 Due to the biophysical profile [DATE] today, she will
 have another biophysical profile repeated tomorrow.

 Due to preeclampsia and fetal growth restriction, I would
 have a low threshold for delivery should her liver function
 tests continue to increase or should her blood pressures
 continue to increase despite medication treatment.

## 2022-09-07 IMAGING — US US MFM FETAL BPP W/O NON-STRESS
1 series · 13 of 28 positions shown · non-contrast
Comparison: none

[Series 1: us mfm fetal bpp w/o non-stress · 28 acquisitions, 13 frames shown]
[im 2/28]
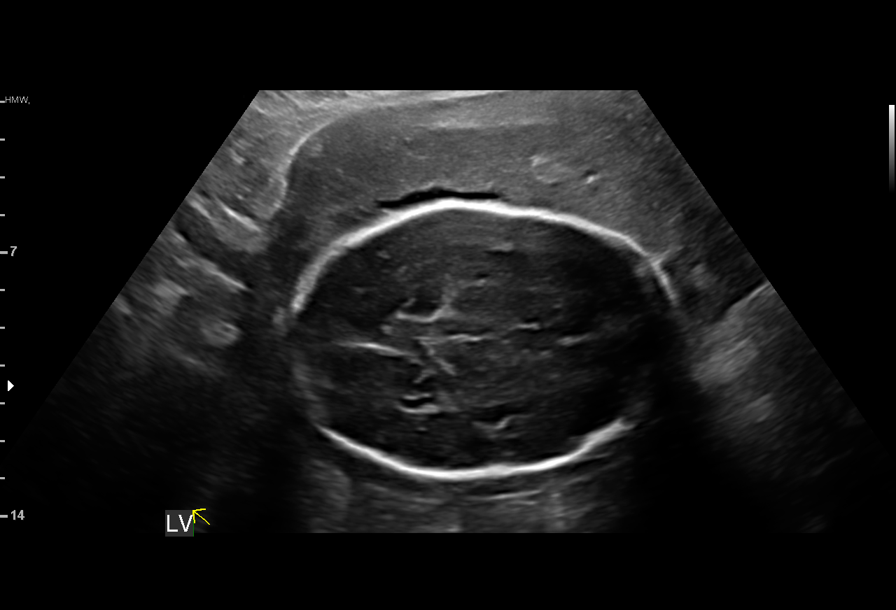
[im 4/28]
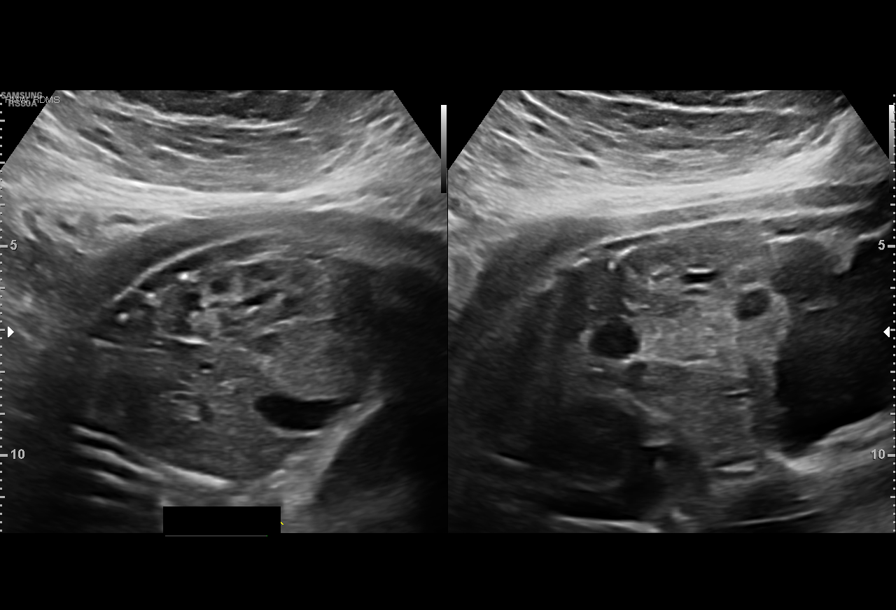
[im 6/28]
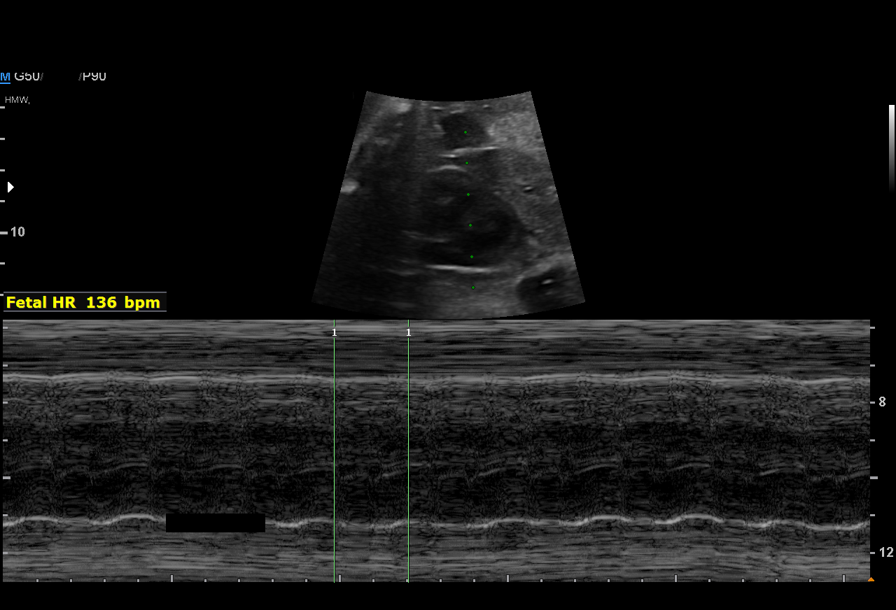
[im 8/28]
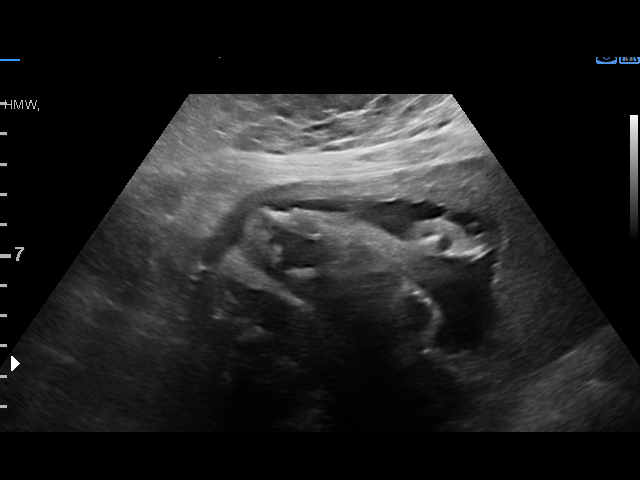
[im 10/28]
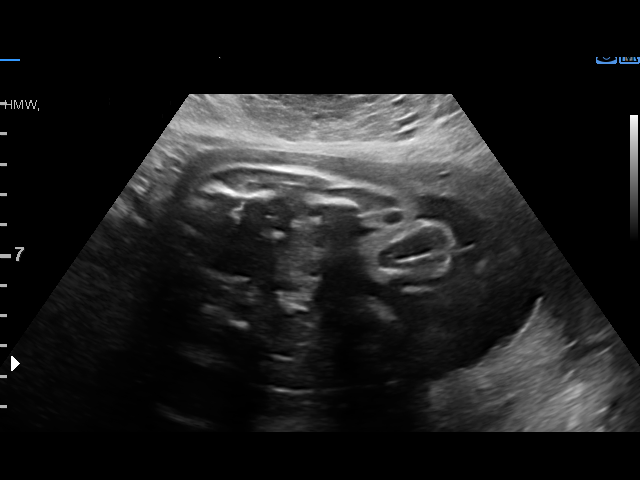
[im 12/28]
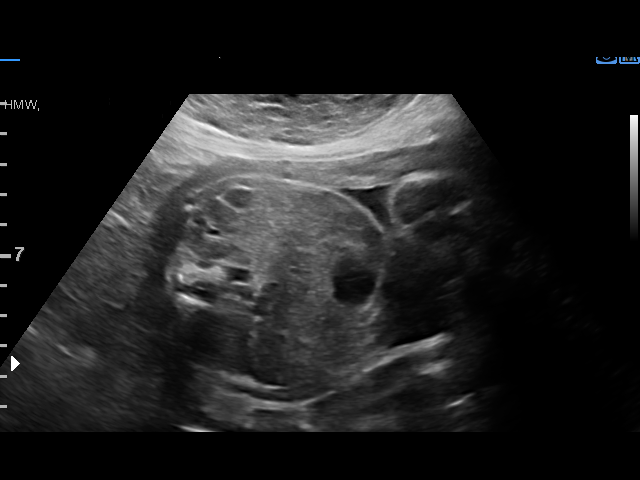
[im 15/28]
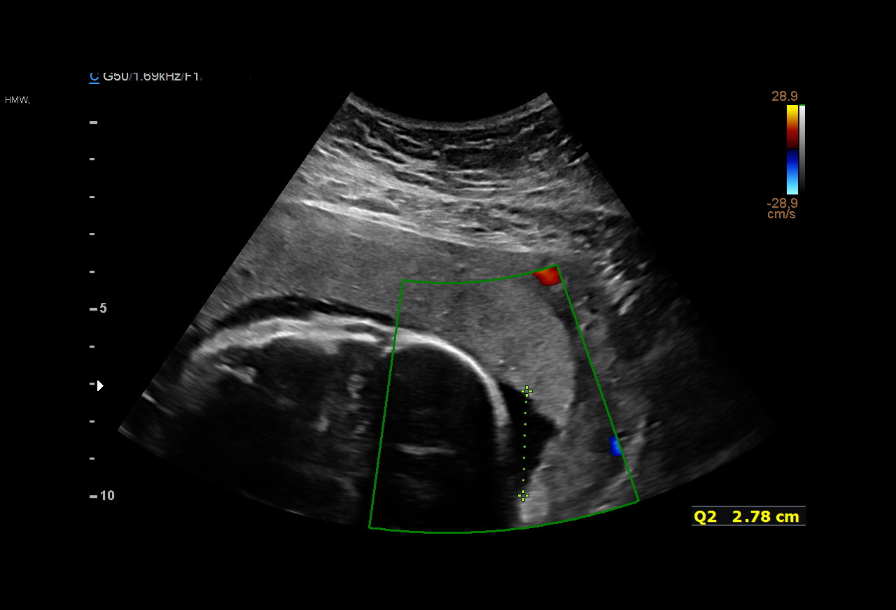
[im 17/28]
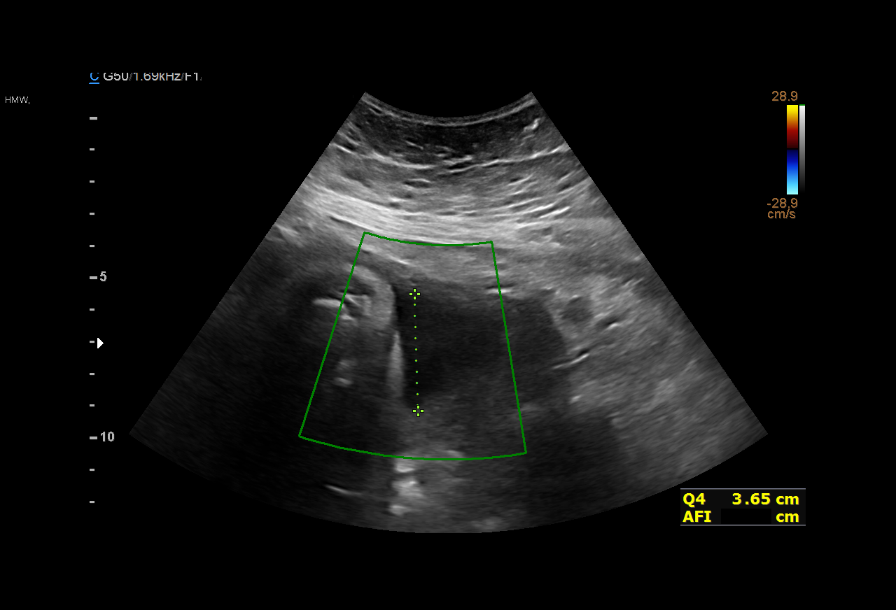
[im 19/28]
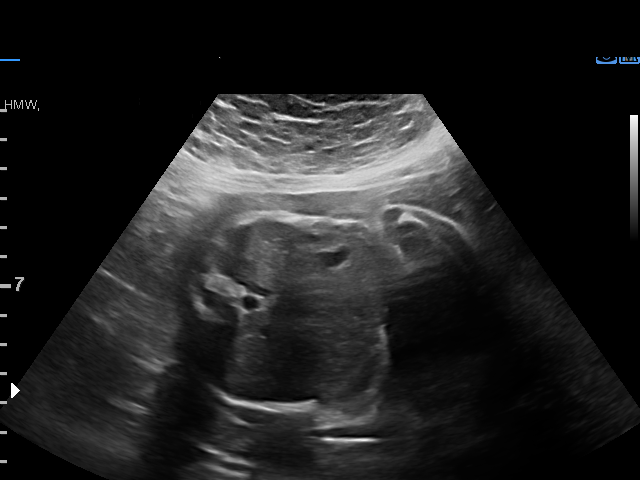
[im 21/28]
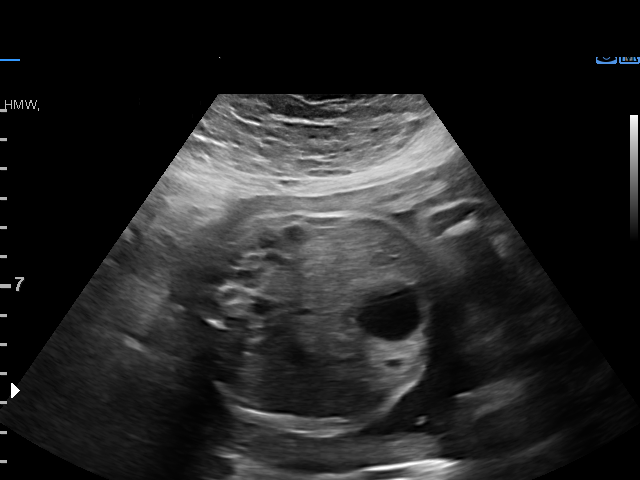
[im 23/28]
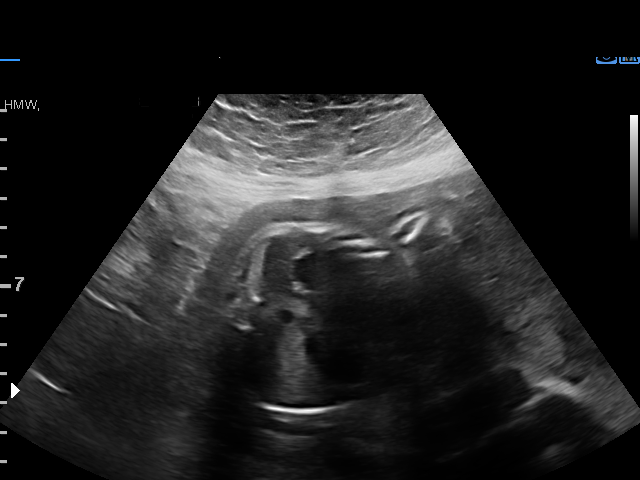
[im 25/28]
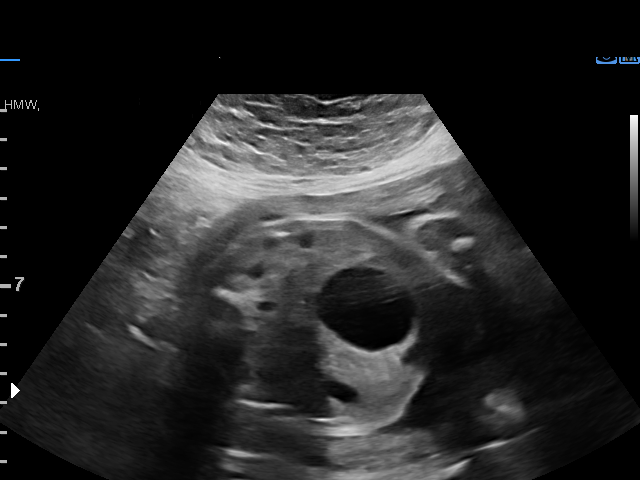
[im 27/28]
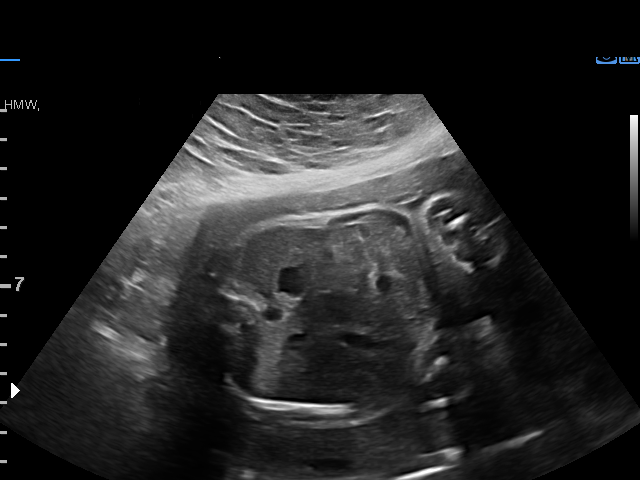

[13 of 28 positions shown; findings below may reference images not displayed]

Indications

 31 weeks gestation of pregnancy
 Severe preeclampsia, third trimester
 Hypertension - Chronic with superimposed
 preeclampsia (labetalol)
 Gestational diabetes in pregnancy, diet
 controlled
 Rh negative state in antepartum
 Small for gestational age fetus affecting
 management of mother
Fetal Evaluation

 Num Of Fetuses:         1
 Fetal Heart Rate(bpm):  136
 Cardiac Activity:       Observed
 Presentation:           Cephalic

 Amniotic Fluid
 AFI FV:      Within normal limits

 AFI Sum(cm)     %Tile       Largest Pocket(cm)
 10.1            16

 RUQ(cm)       RLQ(cm)       LUQ(cm)        LLQ(cm)

Biophysical Evaluation

 Amniotic F.V:   Within normal limits       F. Tone:        Observed
 F. Movement:    Observed                   Score:          [DATE]
 F. Breathing:   Observed
OB History

 Gravidity:    1
Gestational Age

 LMP:           36w 1d        Date:  03/19/20                 EDD:   12/24/20
 Best:          31w 6d     Det. By:  Early Ultrasound         EDD:   01/23/21
                                     (06/19/20)
Anatomy

 Stomach:               Appears normal, left   Bladder:                Appears normal
                        sided
 Kidneys:               Appear normal
Cervix Uterus Adnexa

 Cervix
 Not visualized (advanced GA >83wks)
Impression

 Antenatal testing performed given superimposed
 preeclampsia and XRSCJ
 The biophysical profile was [DATE] with good fetal movement and
 amniotic fluid volume.
Recommendations

 Continue weekly testing
